# Patient Record
Sex: Female | Born: 1965 | Race: White | Hispanic: No | Marital: Married | Smoking: Current every day smoker
Health system: Southern US, Community
[De-identification: ages and names within clinical notes are randomized; demographics above are authoritative.]

## PROBLEM LIST (undated history)

## (undated) DIAGNOSIS — Z87442 Personal history of urinary calculi: Secondary | ICD-10-CM

## (undated) DIAGNOSIS — F419 Anxiety disorder, unspecified: Secondary | ICD-10-CM

## (undated) DIAGNOSIS — T4145XA Adverse effect of unspecified anesthetic, initial encounter: Secondary | ICD-10-CM

## (undated) DIAGNOSIS — K219 Gastro-esophageal reflux disease without esophagitis: Secondary | ICD-10-CM

## (undated) DIAGNOSIS — R7301 Impaired fasting glucose: Secondary | ICD-10-CM

## (undated) DIAGNOSIS — I10 Essential (primary) hypertension: Secondary | ICD-10-CM

## (undated) DIAGNOSIS — F32A Depression, unspecified: Secondary | ICD-10-CM

## (undated) DIAGNOSIS — R519 Headache, unspecified: Secondary | ICD-10-CM

## (undated) DIAGNOSIS — N200 Calculus of kidney: Secondary | ICD-10-CM

## (undated) DIAGNOSIS — L02213 Cutaneous abscess of chest wall: Secondary | ICD-10-CM

## (undated) DIAGNOSIS — Z720 Tobacco use: Secondary | ICD-10-CM

## (undated) DIAGNOSIS — E785 Hyperlipidemia, unspecified: Secondary | ICD-10-CM

## (undated) DIAGNOSIS — T8859XA Other complications of anesthesia, initial encounter: Secondary | ICD-10-CM

## (undated) DIAGNOSIS — R002 Palpitations: Secondary | ICD-10-CM

## (undated) DIAGNOSIS — E669 Obesity, unspecified: Secondary | ICD-10-CM

## (undated) DIAGNOSIS — F329 Major depressive disorder, single episode, unspecified: Secondary | ICD-10-CM

## (undated) HISTORY — DX: Cutaneous abscess of chest wall: L02.213

## (undated) HISTORY — DX: Depression, unspecified: F32.A

## (undated) HISTORY — PX: OTHER SURGICAL HISTORY: SHX169

## (undated) HISTORY — PX: TUBAL LIGATION: SHX77

## (undated) HISTORY — PX: BREAST BIOPSY: SHX20

## (undated) HISTORY — PX: BREAST LUMPECTOMY: SHX2

## (undated) HISTORY — DX: Tobacco use: Z72.0

## (undated) HISTORY — DX: Impaired fasting glucose: R73.01

## (undated) HISTORY — DX: Hyperlipidemia, unspecified: E78.5

## (undated) HISTORY — DX: Gastro-esophageal reflux disease without esophagitis: K21.9

## (undated) HISTORY — DX: Obesity, unspecified: E66.9

## (undated) HISTORY — DX: Anxiety disorder, unspecified: F41.9

## (undated) HISTORY — DX: Calculus of kidney: N20.0

## (undated) HISTORY — DX: Essential (primary) hypertension: I10

## (undated) HISTORY — DX: Major depressive disorder, single episode, unspecified: F32.9

## (undated) HISTORY — DX: Palpitations: R00.2

## (undated) HISTORY — PX: OVARIAN CYST REMOVAL: SHX89

---
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
## 1998-10-30 HISTORY — PX: OTHER SURGICAL HISTORY: SHX169

## 2004-12-16 ENCOUNTER — Emergency Department: Payer: Self-pay | Admitting: Unknown Physician Specialty

## 2004-12-18 ENCOUNTER — Emergency Department: Payer: Self-pay | Admitting: Emergency Medicine

## 2004-12-18 ENCOUNTER — Other Ambulatory Visit: Payer: Self-pay

## 2007-09-26 ENCOUNTER — Ambulatory Visit: Payer: Self-pay | Admitting: Emergency Medicine

## 2008-03-24 ENCOUNTER — Ambulatory Visit: Payer: Self-pay | Admitting: Internal Medicine

## 2009-03-31 ENCOUNTER — Ambulatory Visit: Payer: Self-pay

## 2009-04-09 ENCOUNTER — Ambulatory Visit: Payer: Self-pay | Admitting: Obstetrics and Gynecology

## 2009-04-19 ENCOUNTER — Ambulatory Visit: Payer: Self-pay | Admitting: Obstetrics and Gynecology

## 2010-03-27 ENCOUNTER — Emergency Department: Payer: Self-pay | Admitting: Emergency Medicine

## 2010-09-13 ENCOUNTER — Ambulatory Visit: Payer: Self-pay

## 2010-11-18 ENCOUNTER — Ambulatory Visit: Payer: Self-pay | Admitting: Surgery

## 2010-11-29 ENCOUNTER — Ambulatory Visit: Payer: Self-pay | Admitting: Surgery

## 2011-05-27 ENCOUNTER — Emergency Department: Payer: Self-pay | Admitting: *Deleted

## 2011-12-06 ENCOUNTER — Ambulatory Visit: Payer: Self-pay

## 2012-08-15 ENCOUNTER — Emergency Department: Payer: Self-pay | Admitting: Emergency Medicine

## 2012-08-15 LAB — TROPONIN I: Troponin-I: <0.02 ng/mL

## 2012-08-15 LAB — COMPREHENSIVE METABOLIC PANEL
Alkaline Phosphatase: 79 U/L (ref 50–136)
Anion Gap: 8 (ref 7–16)
BUN: 7 mg/dL (ref 7–18)
Bilirubin,Total: 0.4 mg/dL (ref 0.2–1.0)
Calcium, Total: 8.4 mg/dL — ABNORMAL LOW (ref 8.5–10.1)
Chloride: 105 mmol/L (ref 98–107)
Co2: 28 mmol/L (ref 21–32)
EGFR (Non-African Amer.): >60
Osmolality: 280 (ref 275–301)
Potassium: 4 mmol/L (ref 3.5–5.1)
SGOT(AST): 21 U/L (ref 15–37)
Total Protein: 6.7 g/dL (ref 6.4–8.2)

## 2012-08-15 LAB — CBC
HCT: 37.3 % (ref 35.0–47.0)
HGB: 12.8 g/dL (ref 12.0–16.0)
MCH: 29.2 pg (ref 26.0–34.0)
MCV: 85 fL (ref 80–100)
RBC: 4.37 10*6/uL (ref 3.80–5.20)

## 2012-08-15 LAB — CK TOTAL AND CKMB (NOT AT ARMC): CK, Total: 84 U/L (ref 21–215)

## 2012-11-22 ENCOUNTER — Emergency Department: Payer: Self-pay | Admitting: Emergency Medicine

## 2012-12-10 ENCOUNTER — Ambulatory Visit: Payer: Self-pay

## 2012-12-10 LAB — HM MAMMOGRAPHY

## 2012-12-10 LAB — HM PAP SMEAR

## 2014-03-31 ENCOUNTER — Emergency Department: Payer: Self-pay | Admitting: Emergency Medicine

## 2015-04-19 ENCOUNTER — Telehealth: Payer: Self-pay | Admitting: Family Medicine

## 2015-04-19 NOTE — Telephone Encounter (Signed)
E-Fax came through for refill:  Rx: Vitamin D 50000IU Cap Rx in basket

## 2015-04-19 NOTE — Telephone Encounter (Signed)
Patient notified, left detailed message

## 2015-04-19 NOTE — Telephone Encounter (Signed)
See practice part note from 03/03/15 please Should be taking OTC vitamin D 5,000 iu daily until about early to mid July, then 5,000 iu twice a week Thank you, Dr. Sherie Don

## 2015-08-03 ENCOUNTER — Telehealth: Payer: Self-pay | Admitting: Family Medicine

## 2015-08-03 NOTE — Telephone Encounter (Signed)
Routing to provider  

## 2015-08-03 NOTE — Telephone Encounter (Signed)
Last BP was up a bit; ask her to please stop by for BP check on lab schedule Also, ask her psychiatrist to call me about this prescription; we'd appreciate if the psychiatrist would write this and I'd like to talk to him or her if psychiatrist won't write it and wants me to keep writing it

## 2015-08-03 NOTE — Telephone Encounter (Signed)
Pt called stated she needs a refill on Cymbalta a fax needs to be sent to the [facility]. Pt stated all information needed is in her file. Thanks.

## 2015-08-04 NOTE — Telephone Encounter (Signed)
Thank you for the note; I still need the psychiatrist to call me and she needs to be under the care of a psychiatrist for the dose she is asking me to prescribe; please ask her to contact her psychiatrist and sign whatever releases are needed so I can speak to her psychiatrist before I write the prescription and I'll need to see a copy of the psychiatrist's Rx for this dose

## 2015-08-04 NOTE — Telephone Encounter (Signed)
Patient scheduled for BP check. She states that it's been a while since she went to [facility]. They will write the rx for her but they will not do the patient assistance rx programs. They assistance program is on the only way she can afford it.

## 2015-08-06 NOTE — Telephone Encounter (Signed)
Patient will call [facility] and have them call us and give permission for [place] to speak with them.

## 2015-08-09 MED ORDER — DULOXETINE HCL 60 MG PO CPEP: 60.0000 mg | ORAL_CAPSULE | Freq: Every day | ORAL | Status: DC

## 2015-08-09 NOTE — Telephone Encounter (Signed)
Patient called back and said she spoke to the staff at [facility] and they said you could call and speak with her doctor. Their number is [phone] She also needs her Cymbalta refilled thru the [place] program ASAP, she is out.

## 2015-08-09 NOTE — Telephone Encounter (Signed)
I called [facility] and they said patient needs to be seen They do not have a release on file, her therapist is out I talked to the patient personally She needs to come in; coming in tomorrow Taking cymbalta 60 mg daily, not taking BID and she quit the SSRI on her own She is not seeing the therapist and says she doesn't need to go back

## 2015-08-10 ENCOUNTER — Telehealth: Payer: Self-pay | Admitting: Family Medicine

## 2015-08-10 NOTE — Telephone Encounter (Signed)
Please move patient's "nurse" visit to a doctor visit with me in the next week; she is supposed to come in today for a BP check, but I found out yesterday that she is not seeing a psychiatrist so I need to see her for BP and mood for an official visit

## 2015-08-21 ENCOUNTER — Other Ambulatory Visit: Payer: Self-pay | Admitting: Family Medicine

## 2015-08-21 NOTE — Telephone Encounter (Signed)
She did not keep her last appt I will send one more refill, but I really need to see her Please let [facility] know that I'd like to see patient for an appointment here in the office for:  Mood, blood pressure Please schedule a visit with me  in the next: 30 days Fasting?  Yes (if morning appt, just skip breakfast --- if afternoon appt, okay to have breakfast, just skip lunch) Thank you, Dr. Sherie DonLada

## 2015-08-23 NOTE — Telephone Encounter (Signed)
08/30/15 appointment

## 2015-08-25 DIAGNOSIS — Z720 Tobacco use: Secondary | ICD-10-CM | POA: Insufficient documentation

## 2015-08-25 DIAGNOSIS — I10 Essential (primary) hypertension: Secondary | ICD-10-CM | POA: Insufficient documentation

## 2015-08-25 DIAGNOSIS — F32A Depression, unspecified: Secondary | ICD-10-CM | POA: Insufficient documentation

## 2015-08-25 DIAGNOSIS — R7301 Impaired fasting glucose: Secondary | ICD-10-CM | POA: Insufficient documentation

## 2015-08-25 DIAGNOSIS — E669 Obesity, unspecified: Secondary | ICD-10-CM | POA: Insufficient documentation

## 2015-08-25 DIAGNOSIS — K219 Gastro-esophageal reflux disease without esophagitis: Secondary | ICD-10-CM | POA: Insufficient documentation

## 2015-08-25 DIAGNOSIS — F419 Anxiety disorder, unspecified: Secondary | ICD-10-CM | POA: Insufficient documentation

## 2015-08-25 DIAGNOSIS — R002 Palpitations: Secondary | ICD-10-CM | POA: Insufficient documentation

## 2015-08-25 DIAGNOSIS — F329 Major depressive disorder, single episode, unspecified: Secondary | ICD-10-CM | POA: Insufficient documentation

## 2015-08-30 ENCOUNTER — Encounter: Payer: Self-pay | Admitting: Family Medicine

## 2015-08-30 ENCOUNTER — Ambulatory Visit (INDEPENDENT_AMBULATORY_CARE_PROVIDER_SITE_OTHER): Payer: Self-pay | Admitting: Family Medicine

## 2015-08-30 VITALS — BP 123/85 | HR 74 | Temp 97.6°F | Ht 64.5 in | Wt 228.0 lb

## 2015-08-30 DIAGNOSIS — E559 Vitamin D deficiency, unspecified: Secondary | ICD-10-CM

## 2015-08-30 DIAGNOSIS — E669 Obesity, unspecified: Secondary | ICD-10-CM

## 2015-08-30 DIAGNOSIS — F32A Depression, unspecified: Secondary | ICD-10-CM

## 2015-08-30 DIAGNOSIS — Z23 Encounter for immunization: Secondary | ICD-10-CM

## 2015-08-30 DIAGNOSIS — Z1239 Encounter for other screening for malignant neoplasm of breast: Secondary | ICD-10-CM

## 2015-08-30 DIAGNOSIS — F419 Anxiety disorder, unspecified: Secondary | ICD-10-CM

## 2015-08-30 DIAGNOSIS — F329 Major depressive disorder, single episode, unspecified: Secondary | ICD-10-CM

## 2015-08-30 DIAGNOSIS — Z720 Tobacco use: Secondary | ICD-10-CM

## 2015-08-30 DIAGNOSIS — R7301 Impaired fasting glucose: Secondary | ICD-10-CM

## 2015-08-30 DIAGNOSIS — Z5181 Encounter for therapeutic drug level monitoring: Secondary | ICD-10-CM

## 2015-08-30 DIAGNOSIS — I10 Essential (primary) hypertension: Secondary | ICD-10-CM

## 2015-08-30 MED ORDER — DULOXETINE HCL 60 MG PO CPEP: 60.0000 mg | ORAL_CAPSULE | Freq: Every day | ORAL | Status: DC

## 2015-08-30 NOTE — Assessment & Plan Note (Signed)
Order for mammogram entered; phone number to scheduling provided; patient will call and schedule her own study

## 2015-08-30 NOTE — Assessment & Plan Note (Signed)
Well-controlled currently; continue meds

## 2015-08-30 NOTE — Assessment & Plan Note (Signed)
Normal today; work on modest weight loss

## 2015-08-30 NOTE — Telephone Encounter (Signed)
Patient was seen today.

## 2015-08-30 NOTE — Assessment & Plan Note (Signed)
Check glucose and A1C today; patient was surprised to hear this; we reviewed her last 4 glucose readings, and 3 of the last 4 were completely normal

## 2015-08-30 NOTE — Assessment & Plan Note (Signed)
Patient not quite ready to quit; I am here to help if/when needed; see AVS

## 2015-08-30 NOTE — Assessment & Plan Note (Signed)
Flu vaccine discussed; recommended; given today

## 2015-08-30 NOTE — Progress Notes (Signed)
BP 123/85 mmHg  Pulse 74  Temp(Src) 97.6 F (36.4 C)  Ht 5' 4.5" (1.638 m)  Wt 228 lb (103.42 kg)  BMI 38.55 kg/m2  SpO2 96%  LMP 08/08/2015 (Approximate)   Subjective:    Patient ID: [facility], female    DOB: [birth year], 49 y.o.   MRN: [id number]  HPI: [name] is a 49 y.o. female  Chief Complaint  Patient presents with  . Anxiety  . Depression   No medical excitement Currently cymbalta 60 mg daily and that dose has been stable for awhile Still using clonazepam just once in a while; might go weeks in between; she does not know what triggers the shakiness and the need for a benzo; she gets tensed and panic; medicine helps her without causing her to feel goofy or overmedicated; knows to not drink any alcohol; cautions given  GAD 7 : Generalized Anxiety Score 08/30/2015  Nervous, Anxious, on Edge 1  Control/stop worrying 0  Worry too much - different things 0  Trouble relaxing 0  Restless 0  Easily annoyed or irritable 1  Afraid - awful might happen 0  Total GAD 7 Score 2  Anxiety Difficulty Somewhat difficult   Depression screen PHQ 2/9 08/30/2015  Decreased Interest 0  Down, Depressed, Hopeless 0  PHQ - 2 Score 0   She did get a flu shot this year; she is scared she'll get sick; she gets her mammograms at [facility]  She has lost six pounds since her last visit; BMI 38+; she is still smoking; she says she'll have to quit on her own but is not ready right now  History of vitamin D deficiency; taking 1000 iu every other day which helps reduce body aches  We reviewed her problem list and she was not aware that she had "prediabetes"; it was entered into her chart Feb 2014 by another provider here; she does not recall anyone ever talking to her about this; her mother has diabetes  Relevant past medical, surgical, family and social history reviewed and updated as indicated. Interim medical history since our last visit reviewed. Fam hx HTN, DM  Allergies and  medications reviewed and updated.  Review of Systems Per HPI unless specifically indicated above     Objective:    BP 123/85 mmHg  Pulse 74  Temp(Src) 97.6 F (36.4 C)  Ht 5' 4.5" (1.638 m)  Wt 228 lb (103.42 kg)  BMI 38.55 kg/m2  SpO2 96%  LMP 08/08/2015 (Approximate)  Wt Readings from Last 3 Encounters:  08/30/15 228 lb (103.42 kg)  03/12/15 234 lb (106.142 kg)    Physical Exam  Constitutional: She appears well-developed and well-nourished. No distress.  HENT:  Head: Normocephalic and atraumatic.  Eyes: EOM are normal. No scleral icterus.  Neck: No thyromegaly present.  Cardiovascular: Normal rate, regular rhythm and normal heart sounds.   No murmur heard. Pulmonary/Chest: Effort normal and breath sounds normal. No respiratory distress. She has no wheezes.  Abdominal: Soft. Bowel sounds are normal. She exhibits no distension.  Musculoskeletal: Normal range of motion. She exhibits no edema.  Neurological: She is alert. She exhibits normal muscle tone.  Skin: Skin is warm and dry. She is not diaphoretic. No pallor.  Psychiatric: She has a normal mood and affect. Her speech is normal and behavior is normal. Judgment and thought content normal. Her mood appears not anxious. Her affect is not labile. She does not exhibit a depressed mood.  Good eye contact with examiner  Assessment & Plan:   Problem List Items Addressed This Visit      Cardiovascular and Mediastinum   Hypertension - Primary    Normal today; work on modest weight loss        Endocrine   IFG (impaired fasting glucose)    Check glucose and A1C today; patient was surprised to hear this; we reviewed her last 4 glucose readings, and 3 of the last 4 were completely normal      Relevant Orders   Hgb A1c w/o eAG   Lipid Panel w/o Chol/HDL Ratio     Other   Depression    Well-controlled currently; continue meds      Relevant Medications   DULoxetine (CYMBALTA) 60 MG capsule   Anxiety     Continue SNRI; okay to refill benzo when due; cautions about unintentional overdose reviewed, never mix with pain pills, sleeping pills, alcohol      Relevant Medications   DULoxetine (CYMBALTA) 60 MG capsule   Tobacco use    Patient not quite ready to quit; I am here to help if/when needed; see AVS      Obesity    Praise given for losing 6 pounds, keep it up      Breast cancer screening    Order for mammogram entered; phone number to scheduling provided; patient will call and schedule her own study      Vitamin D deficiency   Relevant Orders   Vit D  25 hydroxy (rtn osteoporosis monitoring)   Medication monitoring encounter    Check labs today      Relevant Orders   Comprehensive metabolic panel   Needs flu shot    Flu vaccine discussed; recommended; given today       Other Visit Diagnoses    Encounter for immunization            Follow up plan: Return 1-2 months, for complete physical, 6 months for mood.  Orders Placed This Encounter  Procedures  . Flu Vaccine QUAD 36+ mos IM  . Hgb A1c w/o eAG  . Comprehensive metabolic panel  . Lipid Panel w/o Chol/HDL Ratio  . Vit D  25 hydroxy (rtn osteoporosis monitoring)   An after-visit summary was printed and given to the patient at check-out.  Please see the patient instructions which may contain other information and recommendations beyond what is mentioned above in the assessment and plan.

## 2015-08-30 NOTE — Patient Instructions (Addendum)
Please do call to schedule your mammogram; the number to schedule one at either [facility] or [facility] is [phone]  You can call 1-800-QUIT-NOW or [phone] for free smoking cessation classes  You received the flu shot today; it should protect you against the flu virus over the coming months; it will take about two weeks for antibodies to develop; do try to stay away from hospitals, nursing homes, and daycares during peak flu season; taking vitamin C daily during flu season may help you avoid getting sick  Check out the information at familydoctor.org entitled "What It Takes to Lose Weight" Try to lose between 1-2 pounds per week by taking in fewer calories and burning off more calories You can succeed by limiting portions, limiting foods dense in calories and fat, becoming more active, and drinking 8 glasses of water a day (64 ounces) Don't skip meals, especially breakfast, as skipping meals may alter your metabolism Do not use over-the-counter weight loss pills or gimmicks that claim rapid weight loss A healthy BMI (or body mass index) is between 18.5 and 24.9 You can calculate your ideal BMI at the NIH website [employer]  Return for physical in the next month or two  Smoking Cessation, Tips for Success If you are ready to quit smoking, congratulations! You have chosen to help yourself be healthier. Cigarettes bring nicotine, tar, carbon monoxide, and other irritants into your body. Your lungs, heart, and blood vessels will be able to work better without these poisons. There are many different ways to quit smoking. Nicotine gum, nicotine patches, a nicotine inhaler, or nicotine nasal spray can help with physical craving. Hypnosis, support groups, and medicines help break the habit of smoking. WHAT THINGS CAN I DO TO MAKE QUITTING EASIER?  Here are some tips to help you quit for good:  Pick a date  when you will quit smoking completely. Tell all of your friends and family about your plan to quit on that date.  Do not try to slowly cut down on the number of cigarettes you are smoking. Pick a quit date and quit smoking completely starting on that day.  Throw away all cigarettes.   Clean and remove all ashtrays from your home, work, and car.  On a card, write down your reasons for quitting. Carry the card with you and read it when you get the urge to smoke.  Cleanse your body of nicotine. Drink enough water and fluids to keep your urine clear or pale yellow. Do this after quitting to flush the nicotine from your body.  Learn to predict your moods. Do not let a bad situation be your excuse to have a cigarette. Some situations in your life might tempt you into wanting a cigarette.  Never have "just one" cigarette. It leads to wanting another and another. Remind yourself of your decision to quit.  Change habits associated with smoking. If you smoked while driving or when feeling stressed, try other activities to replace smoking. Stand up when drinking your coffee. Brush your teeth after eating. Sit in a different chair when you read the paper. Avoid alcohol while trying to quit, and try to drink fewer caffeinated beverages. Alcohol and caffeine may urge you to smoke.  Avoid foods and drinks that can trigger a desire to smoke, such as sugary or spicy foods and alcohol.  Ask people who smoke not to smoke around you.  Have something planned to do right after eating or having a cup of coffee.  For example, plan to take a walk or exercise.  Try a relaxation exercise to calm you down and decrease your stress. Remember, you may be tense and nervous for the first 2 weeks after you quit, but this will pass.  Find new activities to keep your hands busy. Play with a pen, coin, or rubber band. Doodle or draw things on paper.  Brush your teeth right after eating. This will help cut down on the craving  for the taste of tobacco after meals. You can also try mouthwash.   Use oral substitutes in place of cigarettes. Try using lemon drops, carrots, cinnamon sticks, or chewing gum. Keep them handy so they are available when you have the urge to smoke.  When you have the urge to smoke, try deep breathing.  Designate your home as a nonsmoking area.  If you are a heavy smoker, ask your health care provider about a prescription for nicotine chewing gum. It can ease your withdrawal from nicotine.  Reward yourself. Set aside the cigarette money you save and buy yourself something nice.  Look for support from others. Join a support group or smoking cessation program. Ask someone at home or at work to help you with your plan to quit smoking.  Always ask yourself, "Do I need this cigarette or is this just a reflex?" Tell yourself, "Today, I choose not to smoke," or "I do not want to smoke." You are reminding yourself of your decision to quit.  Do not replace cigarette smoking with electronic cigarettes (commonly called e-cigarettes). The safety of e-cigarettes is unknown, and some may contain harmful chemicals.  If you relapse, do not give up! Plan ahead and think about what you will do the next time you get the urge to smoke. HOW WILL I FEEL WHEN I QUIT SMOKING? You may have symptoms of withdrawal because your body is used to nicotine (the addictive substance in cigarettes). You may crave cigarettes, be irritable, feel very hungry, cough often, get headaches, or have difficulty concentrating. The withdrawal symptoms are only temporary. They are strongest when you first quit but will go away within 10-14 days. When withdrawal symptoms occur, stay in control. Think about your reasons for quitting. Remind yourself that these are signs that your body is healing and getting used to being without cigarettes. Remember that withdrawal symptoms are easier to treat than the major diseases that smoking can cause.   Even after the withdrawal is over, expect periodic urges to smoke. However, these cravings are generally short lived and will go away whether you smoke or not. Do not smoke! WHAT RESOURCES ARE AVAILABLE TO HELP ME QUIT SMOKING? Your health care provider can direct you to community resources or hospitals for support, which may include:  Group support.  Education.  Hypnosis.  Therapy.   This information is not intended to replace advice given to you by your health care provider. Make sure you discuss any questions you have with your health care provider.   Document Released: 07/14/2004 Document Revised: 11/06/2014 Document Reviewed: 04/03/2013 Elsevier Interactive Patient Education [employer]2016 Elsevier Inc.

## 2015-08-30 NOTE — Assessment & Plan Note (Signed)
Praise given for losing 6 pounds, keep it up

## 2015-08-30 NOTE — Assessment & Plan Note (Addendum)
Continue SNRI; okay to refill benzo when due; cautions about unintentional overdose reviewed, never mix with pain pills, sleeping pills, alcohol

## 2015-08-30 NOTE — Assessment & Plan Note (Signed)
Check labs today.

## 2015-08-31 ENCOUNTER — Telehealth: Payer: Self-pay

## 2015-08-31 ENCOUNTER — Telehealth: Payer: Self-pay | Admitting: Family Medicine

## 2015-08-31 DIAGNOSIS — R7301 Impaired fasting glucose: Secondary | ICD-10-CM

## 2015-08-31 DIAGNOSIS — E669 Obesity, unspecified: Secondary | ICD-10-CM

## 2015-08-31 DIAGNOSIS — E559 Vitamin D deficiency, unspecified: Secondary | ICD-10-CM

## 2015-08-31 DIAGNOSIS — E781 Pure hyperglyceridemia: Secondary | ICD-10-CM | POA: Insufficient documentation

## 2015-08-31 LAB — LIPID PANEL W/O CHOL/HDL RATIO
Cholesterol, Total: 171 mg/dL (ref 100–199)
HDL: 22 mg/dL — ABNORMAL LOW (ref >39)
Triglycerides: 620 mg/dL (ref 0–149)

## 2015-08-31 LAB — COMPREHENSIVE METABOLIC PANEL
ALK PHOS: 68 IU/L (ref 39–117)
ALT: 13 IU/L (ref 0–32)
AST: 13 IU/L (ref 0–40)
Albumin/Globulin Ratio: 1.7 (ref 1.1–2.5)
Albumin: 4.1 g/dL (ref 3.5–5.5)
BUN/Creatinine Ratio: 14 (ref 9–23)
BUN: 10 mg/dL (ref 6–24)
Bilirubin Total: 0.3 mg/dL (ref 0.0–1.2)
CALCIUM: 9.2 mg/dL (ref 8.7–10.2)
CO2: 24 mmol/L (ref 18–29)
CREATININE: 0.72 mg/dL (ref 0.57–1.00)
Chloride: 95 mmol/L — ABNORMAL LOW (ref 97–106)
GFR calc Af Amer: 115 mL/min/{1.73_m2} (ref >59)
GFR calc non Af Amer: 99 mL/min/{1.73_m2} (ref >59)
GLUCOSE: 94 mg/dL (ref 65–99)
Globulin, Total: 2.4 g/dL (ref 1.5–4.5)
Potassium: 4.2 mmol/L (ref 3.5–5.2)
SODIUM: 137 mmol/L (ref 136–144)
Total Protein: 6.5 g/dL (ref 6.0–8.5)

## 2015-08-31 LAB — VITAMIN D 25 HYDROXY (VIT D DEFICIENCY, FRACTURES): VIT D 25 HYDROXY: 27.9 ng/mL — AB (ref 30.0–100.0)

## 2015-08-31 LAB — HGB A1C W/O EAG: HEMOGLOBIN A1C: 5.6 % (ref 4.8–5.6)

## 2015-08-31 NOTE — Assessment & Plan Note (Signed)
Refer to nutrition therapy for obesity, high TG

## 2015-08-31 NOTE — Telephone Encounter (Signed)
I faxed her Cymbalta rx to [facility] program.

## 2015-08-31 NOTE — Assessment & Plan Note (Signed)
Start taking vit D 1000 iu daily

## 2015-08-31 NOTE — Assessment & Plan Note (Signed)
Explained high TG; start fish oil or krill oil TWO pills BID; recheck lipids in 3 months; work on weight loss, healthy eating, more activity

## 2015-08-31 NOTE — Assessment & Plan Note (Signed)
A1C back in normal range, excellent control

## 2015-08-31 NOTE — Telephone Encounter (Signed)
i talked with patient about lab results; TG very high; start fish oil or krill oil; recheck lipids in 3 months; refer to nutritionist for weight loss and diet coaching for high TG

## 2015-09-14 ENCOUNTER — Other Ambulatory Visit: Payer: Self-pay

## 2015-09-14 ENCOUNTER — Other Ambulatory Visit: Payer: Self-pay | Admitting: Family Medicine

## 2015-09-14 MED ORDER — HYDROCHLOROTHIAZIDE 25 MG PO TABS: 25.0000 mg | ORAL_TABLET | Freq: Every day | ORAL | Status: DC

## 2015-09-14 NOTE — Telephone Encounter (Signed)
Patient was last seen 08/30/15 and pharmacy is [facility].

## 2015-09-30 ENCOUNTER — Encounter: Payer: Self-pay | Admitting: Family Medicine

## 2015-10-01 ENCOUNTER — Encounter: Payer: Self-pay | Admitting: Family Medicine

## 2015-10-01 NOTE — Telephone Encounter (Signed)
Please check on this and take care of it; I prescribed a year's worth at the end of October

## 2015-10-01 NOTE — Telephone Encounter (Signed)
I sent mychart message back to patient stating that the form was sent to them on 08/31/15 and to call them to check on the status. I would refax it, but I can not see the original in epic.

## 2015-10-03 ENCOUNTER — Other Ambulatory Visit: Payer: Self-pay | Admitting: Family Medicine

## 2015-11-25 ENCOUNTER — Other Ambulatory Visit: Payer: Self-pay | Admitting: Family Medicine

## 2015-11-26 NOTE — Telephone Encounter (Signed)
Oct 2016 creatinine, -lytes reviewed; Rx approved

## 2016-01-01 ENCOUNTER — Other Ambulatory Visit: Payer: Self-pay | Admitting: Family Medicine

## 2016-01-03 ENCOUNTER — Other Ambulatory Visit: Payer: Self-pay

## 2016-01-03 MED ORDER — METOPROLOL TARTRATE 25 MG PO TABS: 12.5000 mg | ORAL_TABLET | Freq: Two times a day (BID) | ORAL | Status: DC

## 2016-01-03 NOTE — Telephone Encounter (Signed)
BP and pulse from Oct 2016 reviewed; Rx approved

## 2016-01-03 NOTE — Telephone Encounter (Signed)
Routing to provider  

## 2016-01-26 ENCOUNTER — Other Ambulatory Visit: Payer: Self-pay | Admitting: Family Medicine

## 2016-01-26 NOTE — Telephone Encounter (Signed)
Pt has an appt 02/28/16; Rx approved

## 2016-02-23 ENCOUNTER — Other Ambulatory Visit: Payer: Self-pay

## 2016-02-23 NOTE — Telephone Encounter (Signed)
I just approved a 3 month supply of this medicine on March 6th; please resolve with pharmacy; thank you

## 2016-02-23 NOTE — Telephone Encounter (Signed)
Routing to provider. She is following you to [facility]. 

## 2016-02-28 ENCOUNTER — Ambulatory Visit: Payer: Self-pay | Admitting: Family Medicine

## 2016-04-13 ENCOUNTER — Other Ambulatory Visit: Payer: Self-pay | Admitting: Unknown Physician Specialty

## 2016-04-14 NOTE — Telephone Encounter (Signed)
Is this a pt of ours?

## 2016-04-14 NOTE — Telephone Encounter (Signed)
Pt stated she is staying with [facility] and was scheduled with [facility] for 05/04/16. Thanks.

## 2016-04-14 NOTE — Telephone Encounter (Signed)
It looks like she is Dr. Marlise EvesLada's pt, she was last seen by Dr. Sherie DonLada 07/2015 but has cancelled a few appointments since. Thanks.

## 2016-04-14 NOTE — Telephone Encounter (Signed)
I see that.  Can we ask the pt if she is staying with Dr Sherie DonLada or should we make an appointment for there here?

## 2016-05-04 ENCOUNTER — Ambulatory Visit: Payer: Self-pay | Admitting: Family Medicine

## 2016-05-11 ENCOUNTER — Other Ambulatory Visit: Payer: Self-pay | Admitting: Unknown Physician Specialty

## 2016-05-12 ENCOUNTER — Ambulatory Visit: Payer: Self-pay | Admitting: Family Medicine

## 2016-05-12 NOTE — Telephone Encounter (Signed)
Patient is staying here, she is scheduled to see you on June 01, 2016.

## 2016-05-12 NOTE — Telephone Encounter (Signed)
Your patient.  Thanks 

## 2016-05-12 NOTE — Telephone Encounter (Signed)
Can you find out if she's staying here or going with Dr. Sherie DonLada- she's had appointments scheduled and cancelled at both. Thanks!

## 2016-05-17 ENCOUNTER — Other Ambulatory Visit: Payer: Self-pay | Admitting: Family Medicine

## 2016-05-17 NOTE — Telephone Encounter (Signed)
[facility]

## 2016-06-01 ENCOUNTER — Ambulatory Visit: Payer: Self-pay | Admitting: Urology

## 2016-06-01 ENCOUNTER — Ambulatory Visit: Payer: Self-pay | Admitting: Family Medicine

## 2016-06-01 VITALS — BP 134/84 | HR 90 | Wt 230.0 lb

## 2016-06-01 DIAGNOSIS — Z1239 Encounter for other screening for malignant neoplasm of breast: Secondary | ICD-10-CM

## 2016-06-01 DIAGNOSIS — I10 Essential (primary) hypertension: Secondary | ICD-10-CM

## 2016-06-01 MED ORDER — VARENICLINE TARTRATE 0.5 MG PO TABS: 0.5000 mg | ORAL_TABLET | Freq: Two times a day (BID) | ORAL | 0 refills | Status: DC

## 2016-06-01 MED ORDER — LANSOPRAZOLE 15 MG PO CPDR: 15.0000 mg | DELAYED_RELEASE_CAPSULE | Freq: Every day | ORAL | 0 refills | Status: DC

## 2016-06-01 MED ORDER — HYDROCHLOROTHIAZIDE 25 MG PO TABS: 25.0000 mg | ORAL_TABLET | Freq: Every day | ORAL | 0 refills | Status: DC

## 2016-06-01 MED ORDER — METOPROLOL TARTRATE 25 MG PO TABS: 25.0000 mg | ORAL_TABLET | Freq: Every day | ORAL | 0 refills | Status: DC

## 2016-06-01 NOTE — Progress Notes (Signed)
  Patient: Monica Moody Female    DOB: August 28, 1966   50 y.o.   MRN: [id number] Visit Date: 06/01/2016  Today's Provider: [facility]   Chief Complaint  Patient presents with  . New Patient (Initial Visit)    Wants to get established as a patient so that she can begin taking medications on med list.  . Nicotine Dependence    willing to quit smoking, but needs to find medication that addresses withdrawal symptoms.    Subjective:    HPI Patient has not been without meds.  She has a two week supply of them. Would like to get off the Cymbalta.  Would like to quit smoking.      Allergies  Allergen Reactions  . Bupropion Other (See Comments)    Hands peel  . Celexa [Citalopram Hydrobromide] Other (See Comments)    Affects heart  . Penicillins Swelling   Previous Medications   CHOLECALCIFEROL (VITAMIN D) 1000 UNITS TABLET    Take 1,000 Units by mouth daily. Every other day.   CLONAZEPAM (KLONOPIN) 1 MG TABLET    Take 1 mg by mouth 2 (two) times daily as needed for anxiety.   DULOXETINE (CYMBALTA) 60 MG CAPSULE    TAKE 1 CAPSULE BY MOUTH ONCE DAILY.    Review of Systems  Social History  Substance Use Topics  . Smoking status: Current Every Day Smoker    Packs/day: 0.50    Years: 30.00    Types: Cigarettes  . Smokeless tobacco: Never Used  . Alcohol use No     Comment: occasionally   Objective:   BP 134/84 (BP Location: Left Arm)   Pulse 90   Wt 230 lb (104.3 kg)   LMP 05/25/2016 (Approximate)   BMI 38.87 kg/m   Physical Exam Constitutional: Well nourished. Alert and oriented, No acute distress. HEENT: [place] AT, moist mucus membranes. Trachea midline, no masses. Cardiovascular: No clubbing, cyanosis, or edema. Respiratory: Normal respiratory effort, no increased work of breathing. GI: Abdomen is soft, non tender, non distended, no abdominal masses. Liver and spleen not palpable.  No hernias appreciated.  Stool sample for occult testing is not indicated.   GU: No  CVA tenderness.  No bladder fullness or masses.   Skin: No rashes, bruises or suspicious lesions. Lymph: No cervical or inguinal adenopathy. Neurologic: Grossly intact, no focal deficits, moving all 4 extremities. Psychiatric: Normal mood and affect.      Assessment & Plan:     1. HTN  -good control  -meds refilled  2. Tobacco abuse  -failed patches  -script sent for Chantix  3. GERD  -lansoprazole refilled  4. Anxiety/Depression  -refer to mental health  5. Health maintenance  -needs dentist  -needs eye exam  -needs mammogram  -needs Pap smear    Check TSH, CMP, HbgA1c, CBC and lipids   [facility]   [facility]

## 2016-06-02 LAB — CBC WITH DIFFERENTIAL/PLATELET
BASOS: 0 %
Basophils Absolute: 0 10*3/uL (ref 0.0–0.2)
EOS (ABSOLUTE): 0.1 10*3/uL (ref 0.0–0.4)
EOS: 1 %
HEMATOCRIT: 41.6 % (ref 34.0–46.6)
Hemoglobin: 14 g/dL (ref 11.1–15.9)
Immature Grans (Abs): 0.1 10*3/uL (ref 0.0–0.1)
Immature Granulocytes: 1 %
LYMPHS ABS: 2.4 10*3/uL (ref 0.7–3.1)
Lymphs: 28 %
MCH: 28.9 pg (ref 26.6–33.0)
MCHC: 33.7 g/dL (ref 31.5–35.7)
MCV: 86 fL (ref 79–97)
MONOS ABS: 0.5 10*3/uL (ref 0.1–0.9)
Monocytes: 5 %
Neutrophils Absolute: 5.7 10*3/uL (ref 1.4–7.0)
Neutrophils: 65 %
Platelets: 258 10*3/uL (ref 150–379)
RBC: 4.84 x10E6/uL (ref 3.77–5.28)
RDW: 14.4 % (ref 12.3–15.4)
WBC: 8.8 10*3/uL (ref 3.4–10.8)

## 2016-06-02 LAB — COMPREHENSIVE METABOLIC PANEL
A/G RATIO: 1.5 (ref 1.2–2.2)
ALT: 15 IU/L (ref 0–32)
AST: 17 IU/L (ref 0–40)
Albumin: 4.2 g/dL (ref 3.5–5.5)
Alkaline Phosphatase: 66 IU/L (ref 39–117)
BUN/Creatinine Ratio: 21 (ref 9–23)
BUN: 14 mg/dL (ref 6–24)
Bilirubin Total: 0.3 mg/dL (ref 0.0–1.2)
CALCIUM: 9.6 mg/dL (ref 8.7–10.2)
CO2: 29 mmol/L (ref 18–29)
CREATININE: 0.67 mg/dL (ref 0.57–1.00)
Chloride: 93 mmol/L — ABNORMAL LOW (ref 96–106)
GFR, EST AFRICAN AMERICAN: 119 mL/min/{1.73_m2} (ref >59)
GFR, EST NON AFRICAN AMERICAN: 104 mL/min/{1.73_m2} (ref >59)
GLOBULIN, TOTAL: 2.8 g/dL (ref 1.5–4.5)
Glucose: 81 mg/dL (ref 65–99)
Potassium: 3.5 mmol/L (ref 3.5–5.2)
SODIUM: 138 mmol/L (ref 134–144)
TOTAL PROTEIN: 7 g/dL (ref 6.0–8.5)

## 2016-06-02 LAB — LIPID PANEL
Chol/HDL Ratio: 8.4 ratio units — ABNORMAL HIGH (ref 0.0–4.4)
Cholesterol, Total: 219 mg/dL — ABNORMAL HIGH (ref 100–199)
HDL: 26 mg/dL — AB (ref >39)
TRIGLYCERIDES: 955 mg/dL — AB (ref 0–149)

## 2016-06-02 LAB — TSH: TSH: 2.71 u[IU]/mL (ref 0.450–4.500)

## 2016-06-02 LAB — HEMOGLOBIN A1C
ESTIMATED AVERAGE GLUCOSE: 117 mg/dL
Hgb A1c MFr Bld: 5.7 % — ABNORMAL HIGH (ref 4.8–5.6)

## 2016-06-08 ENCOUNTER — Ambulatory Visit: Payer: Self-pay | Admitting: Licensed Clinical Social Worker

## 2016-06-12 ENCOUNTER — Ambulatory Visit: Payer: Self-pay

## 2016-06-21 ENCOUNTER — Ambulatory Visit: Payer: Self-pay | Admitting: Ophthalmology

## 2016-06-26 ENCOUNTER — Other Ambulatory Visit: Payer: Self-pay | Admitting: Family Medicine

## 2016-06-27 ENCOUNTER — Ambulatory Visit: Payer: Self-pay

## 2016-07-06 ENCOUNTER — Ambulatory Visit: Payer: Self-pay | Admitting: Family Medicine

## 2016-07-06 DIAGNOSIS — E782 Mixed hyperlipidemia: Secondary | ICD-10-CM

## 2016-07-06 DIAGNOSIS — E781 Pure hyperglyceridemia: Secondary | ICD-10-CM

## 2016-07-06 DIAGNOSIS — I10 Essential (primary) hypertension: Secondary | ICD-10-CM

## 2016-07-06 DIAGNOSIS — Z6841 Body Mass Index (BMI) 40.0 and over, adult: Secondary | ICD-10-CM | POA: Insufficient documentation

## 2016-07-06 MED ORDER — METOPROLOL TARTRATE 50 MG PO TABS: 50.0000 mg | ORAL_TABLET | Freq: Two times a day (BID) | ORAL | 4 refills | Status: DC

## 2016-07-06 NOTE — Assessment & Plan Note (Signed)
Discuss elevated triglycerides levels of been drawn nonfasting discussed risk of pancreatitis and management of triglycerides with diet nutrition exercise weight loss. Patient will start this care and recheck fasting lipid panel 1 month.

## 2016-07-06 NOTE — Assessment & Plan Note (Signed)
Discuss hypertension poor control will increase metoprolol from 25-50 mg and patient will take twice a day as indicated.

## 2016-07-06 NOTE — Assessment & Plan Note (Signed)
Discuss weight loss

## 2016-07-06 NOTE — Progress Notes (Signed)
BP (!) 128/94   Pulse 97   Temp 98.4 F (36.9 C)   Resp 16   Ht 5\' 3"  (1.6 m)   Wt 234 lb (106.1 kg)   SpO2 98%   BMI 41.45 kg/m    Subjective:    Patient ID: Monica Moody, female    DOB: 05/27/1966, 50 y.o.   MRN: [id number]  HPI: Monica Moody is a 50 y.o. female  Chief Complaint  Patient presents with  . Follow-up   Patient follow-up with elevated triglycerides has been doing well also elevated blood pressure is noted today. Patient has thought about losing weight but is been gaining.  Relevant past medical, surgical, family and social history reviewed and updated as indicated. Interim medical history since our last visit reviewed. Allergies and medications reviewed and updated.  Review of Systems  Respiratory: Negative.   Cardiovascular: Negative.     Per HPI unless specifically indicated above     Objective:    BP (!) 128/94   Pulse 97   Temp 98.4 F (36.9 C)   Resp 16   Ht 5\' 3"  (1.6 m)   Wt 234 lb (106.1 kg)   SpO2 98%   BMI 41.45 kg/m   Wt Readings from Last 3 Encounters:  07/06/16 234 lb (106.1 kg)  06/01/16 230 lb (104.3 kg)  08/30/15 228 lb (103.4 kg)    Physical Exam  Constitutional: She is oriented to person, place, and time. She appears well-developed and well-nourished. No distress.  HENT:  Head: Normocephalic and atraumatic.  Right Ear: Hearing normal.  Left Ear: Hearing normal.  Nose: Nose normal.  Eyes: Conjunctivae and lids are normal. Right eye exhibits no discharge. Left eye exhibits no discharge. No scleral icterus.  Cardiovascular: Normal rate, regular rhythm and normal heart sounds.   Pulmonary/Chest: Effort normal and breath sounds normal. No respiratory distress.  Musculoskeletal: Normal range of motion.  Neurological: She is alert and oriented to person, place, and time.  Skin: Skin is intact. No rash noted.  Psychiatric: She has a normal mood and affect. Her speech is normal and behavior is normal. Judgment and thought  content normal. Cognition and memory are normal.    Results for orders placed or performed in visit on 06/01/16  CBC with Differential/Platelet  Result Value Ref Range   WBC 8.8 3.4 - 10.8 x10E3/uL   RBC 4.84 3.77 - 5.28 x10E6/uL   Hemoglobin 14.0 11.1 - 15.9 g/dL   Hematocrit 40.9 81.1 - 46.6 %   MCV 86 79 - 97 fL   MCH 28.9 26.6 - 33.0 pg   MCHC 33.7 31.5 - 35.7 g/dL   RDW 91.4 78.2 - 95.6 %   Platelets 258 150 - 379 x10E3/uL   Neutrophils 65 %   Lymphs 28 %   Monocytes 5 %   Eos 1 %   Basos 0 %   Neutrophils Absolute 5.7 1.4 - 7.0 x10E3/uL   Lymphocytes Absolute 2.4 0.7 - 3.1 x10E3/uL   Monocytes Absolute 0.5 0.1 - 0.9 x10E3/uL   EOS (ABSOLUTE) 0.1 0.0 - 0.4 x10E3/uL   Basophils Absolute 0.0 0.0 - 0.2 x10E3/uL   Immature Granulocytes 1 %   Immature Grans (Abs) 0.1 0.0 - 0.1 x10E3/uL  Comprehensive metabolic panel  Result Value Ref Range   Glucose 81 65 - 99 mg/dL   BUN 14 6 - 24 mg/dL   Creatinine, Ser 2.13 0.57 - 1.00 mg/dL   GFR calc non Af Amer 104 >59  mL/min/1.73   GFR calc Af Amer 119 >59 mL/min/1.73   BUN/Creatinine Ratio 21 9 - 23   Sodium 138 134 - 144 mmol/L   Potassium 3.5 3.5 - 5.2 mmol/L   Chloride 93 (L) 96 - 106 mmol/L   CO2 29 18 - 29 mmol/L   Calcium 9.6 8.7 - 10.2 mg/dL   Total Protein 7.0 6.0 - 8.5 g/dL   Albumin 4.2 3.5 - 5.5 g/dL   Globulin, Total 2.8 1.5 - 4.5 g/dL   Albumin/Globulin Ratio 1.5 1.2 - 2.2   Bilirubin Total 0.3 0.0 - 1.2 mg/dL   Alkaline Phosphatase 66 39 - 117 IU/L   AST 17 0 - 40 IU/L   ALT 15 0 - 32 IU/L  Hemoglobin A1c  Result Value Ref Range   Hgb A1c MFr Bld 5.7 (H) 4.8 - 5.6 %   Est. average glucose Bld gHb Est-mCnc 117 mg/dL  TSH  Result Value Ref Range   TSH 2.710 0.450 - 4.500 uIU/mL  Lipid panel  Result Value Ref Range   Cholesterol, Total 219 (H) 100 - 199 mg/dL   Triglycerides 846955 (HH) 0 - 149 mg/dL   HDL 26 (L) >96>39 mg/dL   VLDL Cholesterol Cal Comment 5 - 40 mg/dL   LDL Calculated Comment 0 - 99 mg/dL    Chol/HDL Ratio 8.4 (H) 0.0 - 4.4 ratio units      Assessment & Plan:   Problem List Items Addressed This Visit      Cardiovascular and Mediastinum   Hypertension    Discuss hypertension poor control will increase metoprolol from 25-50 mg and patient will take twice a day as indicated.      Relevant Medications   metoprolol tartrate (LOPRESSOR) 50 MG tablet     Other   Hypertriglyceridemia    Discuss elevated triglycerides levels of been drawn nonfasting discussed risk of pancreatitis and management of triglycerides with diet nutrition exercise weight loss. Patient will start this care and recheck fasting lipid panel 1 month.      Relevant Medications   metoprolol tartrate (LOPRESSOR) 50 MG tablet   BMI 40.0-44.9, adult (HCC)    Discuss weight loss       Other Visit Diagnoses    Elevated triglycerides with high cholesterol       Relevant Medications   metoprolol tartrate (LOPRESSOR) 50 MG tablet       Follow up plan: Return in about 4 weeks (around 08/03/2016) for lipids for elevated triglycerides and recheck blood pressure management.

## 2016-08-01 ENCOUNTER — Other Ambulatory Visit: Payer: Self-pay

## 2016-08-01 DIAGNOSIS — E781 Pure hyperglyceridemia: Secondary | ICD-10-CM

## 2016-08-02 LAB — LIPID PANEL W/O CHOL/HDL RATIO
Cholesterol, Total: 202 mg/dL — ABNORMAL HIGH (ref 100–199)
HDL: 26 mg/dL — ABNORMAL LOW (ref >39)
Triglycerides: 672 mg/dL (ref 0–149)

## 2016-08-22 ENCOUNTER — Ambulatory Visit: Payer: Self-pay | Admitting: Adult Health Nurse Practitioner

## 2016-08-22 VITALS — BP 125/80 | HR 81 | Ht 63.0 in | Wt 234.0 lb

## 2016-08-22 DIAGNOSIS — I10 Essential (primary) hypertension: Secondary | ICD-10-CM

## 2016-08-22 DIAGNOSIS — E782 Mixed hyperlipidemia: Secondary | ICD-10-CM

## 2016-08-22 NOTE — Progress Notes (Signed)
  Patient: Monica Moody Female    DOB: 03/26/1966   50 y.o.   MRN: [id number] Visit Date: 08/22/2016  Today's Provider: [facility]   Chief Complaint  Patient presents with  . Hypertension   Subjective:    HPI   HLD:  Triglycerides 672 on last labs.  Never been on cholesterol medications.  + FH for hyperlipidemia.  Trying to monitor fat/cholesterol in diet.  2x weekly walking.    HTN:  Taking BP meds as directed.  Metoprolol increased at last visit.  Taking BP at home-reports readings are good.    Allergies  Allergen Reactions  . Bupropion Other (See Comments)    Hands peel  . Celexa [Citalopram Hydrobromide] Other (See Comments)    Affects heart  . Penicillins Swelling   Previous Medications   CHOLECALCIFEROL (VITAMIN D) 1000 UNITS TABLET    Take 1,000 Units by mouth daily. Every other day.   CLONAZEPAM (KLONOPIN) 1 MG TABLET    Take 1 mg by mouth 2 (two) times daily as needed for anxiety.   DULOXETINE (CYMBALTA) 60 MG CAPSULE    TAKE 1 CAPSULE BY MOUTH ONCE DAILY.   HYDROCHLOROTHIAZIDE (HYDRODIURIL) 25 MG TABLET    Take 1 tablet (25 mg total) by mouth daily.   LAMOTRIGINE (LAMICTAL) 25 MG TABLET    Take by mouth daily.   LANSOPRAZOLE (PREVACID) 15 MG CAPSULE    Take 1 capsule (15 mg total) by mouth daily at 12 noon.   METOPROLOL TARTRATE (LOPRESSOR) 50 MG TABLET    Take 1 tablet (50 mg total) by mouth 2 (two) times daily.   VARENICLINE (CHANTIX) 0.5 MG TABLET    Take 1 tablet (0.5 mg total) by mouth 2 (two) times daily. For the first three days, take one tablet    Review of Systems  All other systems reviewed and are negative.   Social History  Substance Use Topics  . Smoking status: Current Every Day Smoker    Packs/day: 0.50    Years: 30.00    Types: Cigarettes  . Smokeless tobacco: Never Used  . Alcohol use No     Comment: occasionally   Objective:   BP 125/80 (BP Location: Right Arm, Patient Position: Sitting, Cuff Size: Large)   Pulse 81    Ht 5\' 3"  (1.6 m)   Wt 234 lb (106.1 kg)   BMI 41.45 kg/m   Physical Exam  Constitutional: She is oriented to person, place, and time. She appears well-developed and well-nourished.  HENT:  Head: Normocephalic and atraumatic.  Eyes: Pupils are equal, round, and reactive to light.  Neck: Normal range of motion. Neck supple.  Cardiovascular: Normal rate, regular rhythm and normal heart sounds.   Pulmonary/Chest: Effort normal and breath sounds normal.  Neurological: She is alert and oriented to person, place, and time.  Psychiatric: She has a normal mood and affect.        Assessment & Plan:         HTN:  Controlled.  Goal BP <140/90.  Continue current medication regimen.  Encourage low salt diet and exercise.   HLD:   Not Controlled.  Start Atorvastatin 10mg  nightly. SE profile reviewed.  Encourage low cholesterol, low fat diet and exercise.     [facility]   [facility] [place]

## 2016-09-18 ENCOUNTER — Other Ambulatory Visit: Payer: Self-pay | Admitting: Urology

## 2016-09-18 DIAGNOSIS — I10 Essential (primary) hypertension: Secondary | ICD-10-CM

## 2016-10-10 ENCOUNTER — Ambulatory Visit: Payer: Self-pay | Admitting: Adult Health Nurse Practitioner

## 2016-10-10 DIAGNOSIS — M79641 Pain in right hand: Secondary | ICD-10-CM | POA: Insufficient documentation

## 2016-10-10 DIAGNOSIS — M545 Low back pain, unspecified: Secondary | ICD-10-CM | POA: Insufficient documentation

## 2016-10-10 DIAGNOSIS — G8929 Other chronic pain: Secondary | ICD-10-CM

## 2016-10-10 NOTE — Progress Notes (Signed)
Patient: Monica Moody Female    DOB: 12/11/1965   50 y.o.   MRN: [id number] Visit Date: 10/10/2016  Today's Provider: [facility]   Chief Complaint  Patient presents with  . Pain    Finger joints mainly pinky right hand  . Hypertension   Subjective:    HPI     Pt states she has "bone pain" in her hands. Mostly on the R but hurts occasionally in both hands.  And pain in her back.  Pt states that she has vitamin D deficiency and the pain was better when she received treatment.  Pt states that she is getting knots on her joints that are causing her fingers to bend down.  No numbness or tingling.  Pt states that the pain varies in duration- nothing causes the pain.  Pt states the pain "flares up" 4-5 times per month.  Pt states that her mother has RA- pt has not been checked for RA.  Tylenol/ibuprofen does not help her fingers.   Pt states that her back hurts when she stands or bends. Pt states that it hurts daily- pain varies form 2-9/10.  Pt states that she takes tylenol/ibuprofen with heat patches and feels relief.  Pt states that she has been having the back pain for a few years but notes that it is getting worse.    Allergies  Allergen Reactions  . Bupropion Other (See Comments)    Hands peel  . Celexa [Citalopram Hydrobromide] Other (See Comments)    Affects heart  . Penicillins Swelling   Previous Medications   CHOLECALCIFEROL (VITAMIN D) 1000 UNITS TABLET    Take 1,000 Units by mouth daily. Every other day.   CLONAZEPAM (KLONOPIN) 1 MG TABLET    Take 1 mg by mouth 2 (two) times daily as needed for anxiety.   DULOXETINE (CYMBALTA) 60 MG CAPSULE    TAKE 1 CAPSULE BY MOUTH ONCE DAILY.   HYDROCHLOROTHIAZIDE (HYDRODIURIL) 25 MG TABLET    Take 1 tablet by mouth daily.   LAMOTRIGINE (LAMICTAL) 25 MG TABLET    Take by mouth daily.   LANSOPRAZOLE (PREVACID) 15 MG CAPSULE    Take 1 capsule (15 mg total) by mouth daily at 12 noon.   METOPROLOL TARTRATE (LOPRESSOR)  50 MG TABLET    Take 1 tablet (50 mg total) by mouth 2 (two) times daily.   VARENICLINE (CHANTIX) 0.5 MG TABLET    Take 1 tablet (0.5 mg total) by mouth 2 (two) times daily. For the first three days, take one tablet    Review of Systems  All other systems reviewed and are negative.   Social History  Substance Use Topics  . Smoking status: Current Every Day Smoker    Packs/day: 0.50    Years: 30.00    Types: Cigarettes  . Smokeless tobacco: Never Used  . Alcohol use No     Comment: occasionally   Objective:   BP 132/85   Pulse 85   Wt 227 lb (103 kg)   BMI 40.21 kg/m   Physical Exam  Constitutional: She is oriented to person, place, and time. She appears well-developed and well-nourished.  HENT:  Head: Normocephalic and atraumatic.  Eyes: Pupils are equal, round, and reactive to light.  Neck: Normal range of motion. Neck supple.  Cardiovascular: Normal rate, regular rhythm and normal heart sounds.   Pulmonary/Chest: Effort normal and breath sounds normal.  Abdominal: Soft. Bowel sounds are normal.  Musculoskeletal:  R hand 4 and 5  digit with redness and warmth to the distal joint. Full ROM of all finger joint and wrist. Tenderness to palpation. Slightly swollen.  Back pain with extension and flexion. Tenderness to palpation of the L spine. No spasms felt.   Neurological: She is alert and oriented to person, place, and time.  Skin: Skin is warm and dry.  Psychiatric: She has a normal mood and affect.  Vitals reviewed.       Assessment & Plan:         LBP" Continue tylenol/ibuprofen in rotation.  Will schedule to see the chiropractor.  Continue heat/ice in rotation.   Hand Pain:  Rheumatoid factor, Sed Rate and Uric Acid.    [facility]    [facility] [place]

## 2016-10-12 ENCOUNTER — Other Ambulatory Visit: Payer: Self-pay

## 2016-10-18 ENCOUNTER — Ambulatory Visit: Payer: Self-pay | Admitting: Chiropractor

## 2016-10-18 ENCOUNTER — Encounter: Payer: Self-pay | Admitting: Chiropractor

## 2016-10-18 DIAGNOSIS — M9903 Segmental and somatic dysfunction of lumbar region: Secondary | ICD-10-CM

## 2016-10-18 DIAGNOSIS — R293 Abnormal posture: Secondary | ICD-10-CM

## 2016-10-18 DIAGNOSIS — M62830 Muscle spasm of back: Secondary | ICD-10-CM

## 2016-10-18 DIAGNOSIS — M9901 Segmental and somatic dysfunction of cervical region: Secondary | ICD-10-CM

## 2016-10-18 NOTE — Progress Notes (Signed)
S: Patient presents with central low back pain where the spine meets the sacrum. Patient states she has had the pain for the past few years but is getting significantly worse lately. Patient cannot identify how the pain began or why it is getting worse. Patient states it came on over time. Sitting, rest, and occasional heat makes the LBP improve. She has tried OTC pain meds but they do not improve the pain. Standing, flexion, and household chores that require her to bend over increase the pain. The pain is a general ache. Pain is pinpoint and does not radiate. Pain level today is an 8/10. Pain is constant. No loss of bowel or bladder control. No numbness or weakness.   O: Posture analysis resulted in a right head tilt with a right high shoulder. Patient's left hip was high. Anterior head carriage noted as well.  Csp AROM was WNL. Lumbar AROM was WNL with pain during extension and right lateral flexion.  Segmental dysfunction noted at C2 left, C5 right, T4 left, T9 left, L2 right, L5 right, and SI right Pi. Othro tests - max foraminal compression, Jacksons, distractions and Csp valsalva were negative. Straight leg raise, braggards, hibbs, and Lsp valsalva were also negative. Kemps test elicited left sided SI pain. Yeomans test elicited right sided SI pain. Neuro tests - DTR L4-S1 2, Motor L4-S1 5, Dermatones L4-S1 normal bilateral.  A: Subacute phase, 1x EOW until next re-exam.  P: SMT at segmental levels listed above. Patient received care without incident. ROF completed prior to Tx.

## 2016-10-18 NOTE — Patient Instructions (Signed)
Drink lots of water for the next 24 hours. If soreness develops, ice low back for 20 mins every 2 hours.

## 2016-10-25 ENCOUNTER — Other Ambulatory Visit: Payer: Self-pay | Admitting: Internal Medicine

## 2016-10-26 ENCOUNTER — Other Ambulatory Visit: Payer: Self-pay

## 2016-10-26 DIAGNOSIS — R002 Palpitations: Secondary | ICD-10-CM

## 2016-10-27 LAB — RHEUMATOID FACTOR

## 2016-10-27 LAB — SEDIMENTATION RATE: SED RATE: 8 mm/h (ref 0–32)

## 2016-11-01 ENCOUNTER — Telehealth: Payer: Self-pay

## 2016-11-01 ENCOUNTER — Ambulatory Visit: Payer: Self-pay | Admitting: Chiropractor

## 2016-11-01 NOTE — Telephone Encounter (Signed)
Chiropractor wanted to reschedule missed apt on 11/01/16 for 11/15/16. Left message at 11:06am and asked pt to return call.

## 2016-11-03 ENCOUNTER — Telehealth: Payer: Self-pay

## 2016-11-03 NOTE — Telephone Encounter (Signed)
Called pt to give results. Pt verbalized understanding.  

## 2016-11-03 NOTE — Telephone Encounter (Signed)
-----   Message from Harle BattiestShannon A McGowan, PA-C sent at 11/01/2016  9:50 PM EST ----- Please let the patient know that her tests for rheumatoid are negative.

## 2016-11-22 ENCOUNTER — Other Ambulatory Visit: Payer: Self-pay | Admitting: Urology

## 2016-12-26 ENCOUNTER — Ambulatory Visit: Payer: Self-pay

## 2017-01-01 ENCOUNTER — Telehealth: Payer: Self-pay | Admitting: Pharmacist

## 2017-01-01 NOTE — Telephone Encounter (Signed)
01/01/17 Called [employer] for refill on Chantix, to release March 23

## 2017-01-02 ENCOUNTER — Ambulatory Visit: Payer: Self-pay | Admitting: Urology

## 2017-01-02 VITALS — BP 123/80 | HR 80 | Temp 97.0°F | Ht 63.0 in | Wt 234.6 lb

## 2017-01-02 DIAGNOSIS — E781 Pure hyperglyceridemia: Secondary | ICD-10-CM

## 2017-01-02 MED ORDER — ATORVASTATIN CALCIUM 10 MG PO TABS: 10.0000 mg | ORAL_TABLET | Freq: Every day | ORAL | 0 refills | Status: DC

## 2017-01-02 NOTE — Progress Notes (Signed)
  Patient: Pollyann Savoymy F Heinkel Female    DOB: 08/28/1966   51 y.o.   MRN: [id number] Visit Date: 01/02/2017  Today's Provider: [facility]   Chief Complaint  Patient presents with  . Follow-up    Was called for a check up appointment   Subjective:    HPI Patient has not had her lipids checked since she has been on Atorvastatin 10 mg qhs since October 2017.          Allergies  Allergen Reactions  . Bupropion Other (See Comments)    Hands peel  . Celexa [Citalopram Hydrobromide] Other (See Comments)    Affects heart  . Penicillins Swelling   Previous Medications   CHANTIX 1 MG TABLET    TAKE ONE TABLET BY MOUTH 2 TIMES A DAY   CHOLECALCIFEROL (VITAMIN D) 1000 UNITS TABLET    Take 1,000 Units by mouth daily. Every other day.   CLONAZEPAM (KLONOPIN) 1 MG TABLET    Take 1 mg by mouth 2 (two) times daily as needed for anxiety.   DULOXETINE (CYMBALTA) 60 MG CAPSULE    TAKE 1 CAPSULE BY MOUTH ONCE DAILY.   HYDROCHLOROTHIAZIDE (HYDRODIURIL) 25 MG TABLET    Take 1 tablet by mouth daily.   LAMOTRIGINE (LAMICTAL) 25 MG TABLET    Take by mouth daily.   LANSOPRAZOLE (PREVACID) 15 MG CAPSULE    Take 1 capsule (15 mg total) by mouth daily at 12 noon.   METOPROLOL TARTRATE (LOPRESSOR) 50 MG TABLET    Take 1 tablet (50 mg total) by mouth 2 (two) times daily.   VARENICLINE (CHANTIX) 0.5 MG TABLET    Take 1 tablet (0.5 mg total) by mouth 2 (two) times daily. For the first three days, take one tablet    Review of Systems  All other systems reviewed and are negative.   Social History  Substance Use Topics  . Smoking status: Current Every Day Smoker    Packs/day: 0.50    Years: 30.00    Types: Cigarettes  . Smokeless tobacco: Never Used  . Alcohol use Yes     Comment: once every few months   Objective:   BP 123/80   Pulse 80   Temp 97 F (36.1 C)   Ht 5\' 3"  (1.6 m)   Wt 234 lb 9.6 oz (106.4 kg)   LMP 12/12/2016 (Within Days)   BMI 41.56 kg/m   Physical Exam  Constitutional:  She is oriented to person, place, and time. She appears well-developed and well-nourished.  HENT:  Head: Normocephalic and atraumatic.  Eyes: Pupils are equal, round, and reactive to light.  Neck: Normal range of motion. Neck supple.  Cardiovascular: Normal rate, regular rhythm and normal heart sounds.   Pulmonary/Chest: Effort normal and breath sounds normal.  Abdominal: Soft. Bowel sounds are normal.  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  Skin: Skin is warm and dry.  Psychiatric: She has a normal mood and affect.  Vitals reviewed.       Assessment & Plan:     Hypertriglyceridemia On Atorvastatin 10 mg daily Check lipids today     LBP" Seen chiropractor  Hand Pain:  Rheumatoid factor, Sed Rate were normal Uric Acid drawn tonight   [facility]    [facility] [place]

## 2017-01-03 LAB — URIC ACID: Uric Acid: 4.7 mg/dL (ref 2.5–7.1)

## 2017-01-03 LAB — LIPID PANEL
CHOLESTEROL TOTAL: 153 mg/dL (ref 100–199)
Chol/HDL Ratio: 5.5 ratio units — ABNORMAL HIGH (ref 0.0–4.4)
HDL: 28 mg/dL — ABNORMAL LOW (ref >39)
TRIGLYCERIDES: 584 mg/dL — AB (ref 0–149)

## 2017-01-05 ENCOUNTER — Telehealth: Payer: Self-pay

## 2017-01-05 NOTE — Telephone Encounter (Signed)
Called pt with results. PT verbalized understanding. 

## 2017-01-05 NOTE — Telephone Encounter (Signed)
-----   Message from Harle BattiestShannon A McGowan, PA-C sent at 01/04/2017  8:31 PM EST ----- Please have the patient increase her Lipitor to two pills daily and recheck lipids in 6 weeks.

## 2017-01-09 ENCOUNTER — Other Ambulatory Visit: Payer: Self-pay

## 2017-01-31 ENCOUNTER — Other Ambulatory Visit: Payer: Self-pay | Admitting: Adult Health Nurse Practitioner

## 2017-02-07 ENCOUNTER — Ambulatory Visit
Admission: RE | Admit: 2017-02-07 | Discharge: 2017-02-07 | Disposition: A | Discharge status: Home / Self Care | Payer: Self-pay | Source: Ambulatory Visit | Attending: Oncology | Admitting: Oncology

## 2017-02-07 ENCOUNTER — Encounter: Payer: Self-pay | Admitting: *Deleted

## 2017-02-07 ENCOUNTER — Ambulatory Visit: Payer: Self-pay | Attending: Oncology | Admitting: *Deleted

## 2017-02-07 VITALS — BP 136/87 | HR 72 | Temp 95.7°F | Resp 18 | Ht 64.0 in | Wt 225.0 lb

## 2017-02-07 DIAGNOSIS — Z0000 Encounter for general adult medical examination without abnormal findings: Secondary | ICD-10-CM

## 2017-02-07 NOTE — Progress Notes (Signed)
Subjective:     Patient ID: Monica Moody, female   DOB: 05-15-1966, 51 y.o.   MRN: [id number]  HPI   Review of Systems     Objective:   Physical Exam  Pulmonary/Chest: Right breast exhibits no inverted nipple, no mass, no nipple discharge, no skin change and no tenderness. Left breast exhibits no inverted nipple, no mass, no nipple discharge, no skin change and no tenderness. Breasts are asymmetrical.    Right breast larger than the left  Abdominal: There is no splenomegaly or hepatomegaly.  Genitourinary: Rectal exam shows no mass. No labial fusion. There is no rash, tenderness, lesion or injury on the right labia. There is no rash, tenderness, lesion or injury on the left labia. Cervix exhibits no motion tenderness, no discharge and no friability. Right adnexum displays no mass, no tenderness and no fullness. Left adnexum displays no mass, no tenderness and no fullness. No erythema, tenderness or bleeding in the vagina. No foreign body in the vagina. No signs of injury around the vagina. No vaginal discharge found.       Assessment:     51 year old White female returns to [facility] for annual screening.  Clinical breast exam unremarkable.  Taught self breast awareness.  Specimen collected for pap smear without difficulty.  Patient has been screened for eligibility.  She does not have any insurance, Medicare or Medicaid.  She also meets financial eligibility.  Hand-out given on the Affordable Care Act.    Plan:     Screening mammogram ordered.  Specimen for pap smear sent to the lab.  Will follow-up per [facility] protocol.

## 2017-02-07 NOTE — Patient Instructions (Signed)
HPV Test The human papillomavirus (HPV) test is used to look for high-risk types of HPV infection. HPV is a group of about 100 viruses. Many of these viruses cause growths on, in, or around the genitals. Most HPV viruses cause infections that usually go away without treatment. However, HPV types 6, 11, 16, and 18 are considered high-risk types of HPV that can increase your risk of cancer of the cervix or anus if the infection is left untreated. An HPV test identifies the DNA (genetic) strands of the HPV infection, so it is also referred to as the HPV DNA test. Although HPV is found in both males and females, the HPV test is only used to screen for increased cancer risk in females:  With an abnormal Pap test.  After treatment of an abnormal Pap test.  Between the ages of 30 and 65.  After treatment of a high-risk HPV infection. The HPV test may be done at the same time as a pelvic exam and Pap test in females over the age of 30. Both the HPV test and Pap test require a sample of cells from the cervix. How do I prepare for this test?  Do not douche or take a bath for 24-48 hours before the test or as directed by your health care provider.  Do not have sex for 24-48 hours before the test or as directed by your health care provider.  You may be asked to reschedule the test if you are menstruating.  You will be asked to urinate before the test. What do the results mean? It is your responsibility to obtain your test results. Ask the lab or department performing the test when and how you will get your results. Talk with your health care provider if you have any questions about your results. Your result will be negative or positive. Meaning of Negative Test Results  A negative HPV test result means that no HPV was found, and it is very likely that you do not have HPV. Meaning of Positive Test Results  A positive HPV test result indicates that you have HPV.  If your test result shows the presence  of any high-risk HPV strains, you may have an increased risk of developing cancer of the cervix or anus if the infection is left untreated.  If any low-risk HPV strains are found, you are not likely to have an increased risk of cancer. Discuss your test results with your health care provider. He or she will use the results to make a diagnosis and determine a treatment plan that is right for you. Talk with your health care provider to discuss your results, treatment options, and if necessary, the need for more tests. Talk with your health care provider if you have any questions about your results. This information is not intended to replace advice given to you by your health care provider. Make sure you discuss any questions you have with your health care provider. Document Released: 11/10/2004 Document Revised: 06/21/2016 Document Reviewed: 03/03/2014 Elsevier Interactive Patient Education  2017 Elsevier Inc.  Gave patient hand-out, Women Staying Healthy, Active and Well from [facility], with education on breast health, pap smears, heart and colon health.  

## 2017-02-09 LAB — PAP LB AND HPV HIGH-RISK
HPV, HIGH-RISK: NEGATIVE
PAP Smear Comment: 0

## 2017-02-14 ENCOUNTER — Other Ambulatory Visit: Payer: Self-pay

## 2017-02-14 DIAGNOSIS — E7800 Pure hypercholesterolemia, unspecified: Secondary | ICD-10-CM

## 2017-02-15 ENCOUNTER — Other Ambulatory Visit: Payer: Self-pay | Admitting: Urology

## 2017-02-15 LAB — LIPID PANEL
Chol/HDL Ratio: 4.4 ratio (ref 0.0–4.4)
Cholesterol, Total: 123 mg/dL (ref 100–199)
HDL: 28 mg/dL — AB (ref >39)
LDL CALC: 17 mg/dL (ref 0–99)
Triglycerides: 388 mg/dL — ABNORMAL HIGH (ref 0–149)
VLDL CHOLESTEROL CAL: 78 mg/dL — AB (ref 5–40)

## 2017-02-15 MED ORDER — ATORVASTATIN CALCIUM 20 MG PO TABS: 20.0000 mg | ORAL_TABLET | Freq: Every day | ORAL | 0 refills | Status: DC

## 2017-02-16 ENCOUNTER — Telehealth: Payer: Self-pay

## 2017-02-16 NOTE — Telephone Encounter (Signed)
-----   Message from Harle Battiest, PA-C sent at 02/15/2017  7:57 PM EDT ----- Patient's lipids are improved.  Continue Lipitor 20 mg daily.  RTC in 6 months.

## 2017-02-16 NOTE — Telephone Encounter (Signed)
Called pt with results. PT verbalized understanding. 

## 2017-02-20 ENCOUNTER — Encounter: Payer: Self-pay | Admitting: *Deleted

## 2017-02-20 NOTE — Progress Notes (Signed)
Letter mailed from [facility] to inform patient of her normal mammogram results.  Patient is to follow-up with annual screening in one year.  HSIS to Christy. 

## 2017-03-06 ENCOUNTER — Telehealth: Payer: Self-pay | Admitting: Nurse Practitioner

## 2017-03-06 NOTE — Telephone Encounter (Signed)
Wants to see dr for possible UTI

## 2017-03-07 NOTE — Telephone Encounter (Signed)
Made pt appt

## 2017-03-08 ENCOUNTER — Telehealth: Payer: Self-pay | Admitting: Pharmacist

## 2017-03-08 NOTE — Telephone Encounter (Signed)
03/08/17 Called [employer] for refill on Chantix 1mg .

## 2017-03-15 ENCOUNTER — Ambulatory Visit: Payer: Self-pay

## 2017-03-27 ENCOUNTER — Telehealth: Payer: Self-pay | Admitting: Pharmacist

## 2017-03-27 NOTE — Telephone Encounter (Signed)
[facility] till March [facility].Forde RadonAJ

## 2017-04-03 ENCOUNTER — Ambulatory Visit: Payer: Self-pay

## 2017-04-19 ENCOUNTER — Ambulatory Visit: Payer: Self-pay | Admitting: Urology

## 2017-04-19 DIAGNOSIS — I10 Essential (primary) hypertension: Secondary | ICD-10-CM

## 2017-04-19 MED ORDER — LANSOPRAZOLE 15 MG PO CPDR: 15.0000 mg | DELAYED_RELEASE_CAPSULE | Freq: Every day | ORAL | 3 refills | Status: DC

## 2017-04-19 MED ORDER — HYDROCHLOROTHIAZIDE 25 MG PO TABS: 25.0000 mg | ORAL_TABLET | Freq: Every day | ORAL | 1 refills | Status: DC

## 2017-04-19 NOTE — Progress Notes (Signed)
  Patient: Monica Moody Female    DOB: 02/14/1966   51 y.o.   MRN: [id number] Visit Date: 04/19/2017  Today's Provider: [facility]   Chief Complaint  Patient presents with  . Follow-up   Subjective:    HPI Patient following up for UTI.  She states she was having pain and pressure with frequency and dribbling.  She started drinking cranberry juice and symptoms have abated.    Her last period was in March.  She is experiencing hot flashes.  She does not want to use hormones.      Allergies  Allergen Reactions  . Bupropion Other (See Comments)    Hands peel  . Celexa [Citalopram Hydrobromide] Other (See Comments)    Affects heart  . Penicillins Swelling   Previous Medications   ATORVASTATIN (LIPITOR) 20 MG TABLET    Take 1 tablet (20 mg total) by mouth daily.   CHANTIX 1 MG TABLET    TAKE ONE TABLET BY MOUTH 2 TIMES A DAY   CHOLECALCIFEROL (VITAMIN D) 1000 UNITS TABLET    Take 1,000 Units by mouth daily. Every other day.   CLONAZEPAM (KLONOPIN) 1 MG TABLET    Take 1 mg by mouth 2 (two) times daily as needed for anxiety.   DULOXETINE (CYMBALTA) 60 MG CAPSULE    TAKE 1 CAPSULE BY MOUTH ONCE DAILY.   FLUVOXAMINE (LUVOX) 50 MG TABLET    Take 50 mg by mouth at bedtime.   HYDROCHLOROTHIAZIDE (HYDRODIURIL) 25 MG TABLET    Take 1 tablet by mouth daily.   LAMOTRIGINE (LAMICTAL) 25 MG TABLET    Take by mouth daily.   LANSOPRAZOLE (PREVACID) 15 MG CAPSULE    Take 1 capsule (15 mg total) by mouth daily at 12 noon.   METOPROLOL TARTRATE (LOPRESSOR) 50 MG TABLET    Take 1 tablet (50 mg total) by mouth 2 (two) times daily.   VARENICLINE (CHANTIX) 0.5 MG TABLET    Take 1 tablet (0.5 mg total) by mouth 2 (two) times daily. For the first three days, take one tablet    Review of Systems  Social History  Substance Use Topics  . Smoking status: Current Every Day Smoker    Packs/day: 0.50    Years: 30.00    Types: Cigarettes  . Smokeless tobacco: Never Used  . Alcohol use Yes   Comment: once every few months   Objective:   BP 129/80   Pulse 82   Temp 98.2 F (36.8 C)   Wt 241 lb 11.2 oz (109.6 kg)   LMP 01/17/2017   BMI 41.49 kg/m   Physical Exam Molluscum contagiosum on legs     Assessment & Plan:     1. Hot flashes  - patient will start primose oil and Vitamin E as she does not want to use hormones  2. Perimenopausal symptoms.    - see above  3. Molluscum contagiosum  - patient reassurred   [facility]   [facility] [place]

## 2017-05-10 ENCOUNTER — Telehealth: Payer: Self-pay | Admitting: Pharmacist

## 2017-05-10 NOTE — Telephone Encounter (Signed)
05/10/17 Called [employer] and placed refill for Chantix 1mg .Monica Moody

## 2017-05-18 ENCOUNTER — Other Ambulatory Visit: Payer: Self-pay | Admitting: Urology

## 2017-05-21 ENCOUNTER — Other Ambulatory Visit: Payer: Self-pay | Admitting: Internal Medicine

## 2017-05-28 ENCOUNTER — Other Ambulatory Visit: Payer: Self-pay | Admitting: Urology

## 2017-05-28 MED ORDER — LANSOPRAZOLE 30 MG PO TBDP: 30.0000 mg | ORAL_TABLET | Freq: Every day | ORAL | 3 refills | Status: DC

## 2017-06-21 ENCOUNTER — Other Ambulatory Visit: Payer: Self-pay

## 2017-06-21 DIAGNOSIS — I10 Essential (primary) hypertension: Secondary | ICD-10-CM

## 2017-06-21 DIAGNOSIS — K219 Gastro-esophageal reflux disease without esophagitis: Secondary | ICD-10-CM

## 2017-06-22 LAB — COMPREHENSIVE METABOLIC PANEL
A/G RATIO: 1.8 (ref 1.2–2.2)
ALK PHOS: 58 IU/L (ref 39–117)
ALT: 13 IU/L (ref 0–32)
AST: 18 IU/L (ref 0–40)
Albumin: 4.3 g/dL (ref 3.5–5.5)
BILIRUBIN TOTAL: 0.3 mg/dL (ref 0.0–1.2)
BUN/Creatinine Ratio: 21 (ref 9–23)
BUN: 13 mg/dL (ref 6–24)
CHLORIDE: 94 mmol/L — AB (ref 96–106)
CO2: 25 mmol/L (ref 20–29)
Calcium: 9.4 mg/dL (ref 8.7–10.2)
Creatinine, Ser: 0.62 mg/dL (ref 0.57–1.00)
GFR calc Af Amer: 122 mL/min/{1.73_m2} (ref >59)
GFR, EST NON AFRICAN AMERICAN: 105 mL/min/{1.73_m2} (ref >59)
GLOBULIN, TOTAL: 2.4 g/dL (ref 1.5–4.5)
Glucose: 74 mg/dL (ref 65–99)
POTASSIUM: 3.5 mmol/L (ref 3.5–5.2)
SODIUM: 137 mmol/L (ref 134–144)
Total Protein: 6.7 g/dL (ref 6.0–8.5)

## 2017-06-22 LAB — HEMOGLOBIN A1C
ESTIMATED AVERAGE GLUCOSE: 108 mg/dL
HEMOGLOBIN A1C: 5.4 % (ref 4.8–5.6)

## 2017-06-22 LAB — TSH: TSH: 3.52 u[IU]/mL (ref 0.450–4.500)

## 2017-06-22 LAB — CBC
HEMATOCRIT: 40.1 % (ref 34.0–46.6)
Hemoglobin: 13.7 g/dL (ref 11.1–15.9)
MCH: 29.3 pg (ref 26.6–33.0)
MCHC: 34.2 g/dL (ref 31.5–35.7)
MCV: 86 fL (ref 79–97)
PLATELETS: 248 10*3/uL (ref 150–379)
RBC: 4.68 x10E6/uL (ref 3.77–5.28)
RDW: 14.6 % (ref 12.3–15.4)
WBC: 7.7 10*3/uL (ref 3.4–10.8)

## 2017-06-28 ENCOUNTER — Ambulatory Visit: Payer: Self-pay | Admitting: Adult Health Nurse Practitioner

## 2017-06-28 VITALS — BP 145/91 | HR 85 | Temp 98.5°F | Wt 236.1 lb

## 2017-06-28 DIAGNOSIS — I10 Essential (primary) hypertension: Secondary | ICD-10-CM

## 2017-06-28 DIAGNOSIS — E781 Pure hyperglyceridemia: Secondary | ICD-10-CM

## 2017-06-28 DIAGNOSIS — K219 Gastro-esophageal reflux disease without esophagitis: Secondary | ICD-10-CM

## 2017-06-28 MED ORDER — HYDROCHLOROTHIAZIDE 25 MG PO TABS: 25.0000 mg | ORAL_TABLET | Freq: Every day | ORAL | 1 refills | Status: DC

## 2017-06-28 MED ORDER — ATORVASTATIN CALCIUM 20 MG PO TABS: 20.0000 mg | ORAL_TABLET | Freq: Every day | ORAL | 1 refills | Status: DC

## 2017-06-28 MED ORDER — LANSOPRAZOLE 15 MG PO CPDR: 15.0000 mg | DELAYED_RELEASE_CAPSULE | Freq: Every day | ORAL | 3 refills | Status: DC

## 2017-06-28 MED ORDER — METOPROLOL TARTRATE 50 MG PO TABS: 50.0000 mg | ORAL_TABLET | Freq: Two times a day (BID) | ORAL | 4 refills | Status: DC

## 2017-06-28 NOTE — Progress Notes (Signed)
  Patient: Monica Moody Female    DOB: 03/28/1966   51 y.o.   MRN: [id number] Visit Date: 06/28/2017  Today's Provider: Jacelyn Pieah Doles-Johnson, NP   Chief Complaint  Patient presents with  . Follow-up   Subjective:    HPI   BP elevated tonight.  Denies CP, dizziness or HA.  Taking medications as directed.  Last visit BP was 129/80.     Allergies  Allergen Reactions  . Bupropion Other (See Comments)    Hands peel  . Celexa [Citalopram Hydrobromide] Other (See Comments)    Affects heart  . Penicillins Swelling   Previous Medications   ATORVASTATIN (LIPITOR) 20 MG TABLET    TAKE ONE TABLET BY MOUTH EVERY DAY   CHANTIX 1 MG TABLET    TAKE ONE TABLET BY MOUTH 2 TIMES A DAY   CHOLECALCIFEROL (VITAMIN D) 1000 UNITS TABLET    Take 1,000 Units by mouth daily. Every other day.   DULOXETINE (CYMBALTA) 60 MG CAPSULE    TAKE 1 CAPSULE BY MOUTH ONCE DAILY.   FLUVOXAMINE (LUVOX) 50 MG TABLET    Take 50 mg by mouth at bedtime.   HYDROCHLOROTHIAZIDE (HYDRODIURIL) 25 MG TABLET    Take 1 tablet (25 mg total) by mouth daily.   LAMOTRIGINE (LAMICTAL) 25 MG TABLET    Take by mouth daily.   LANSOPRAZOLE (PREVACID SOLUTAB) 30 MG DISINTEGRATING TABLET    Take 1 tablet (30 mg total) by mouth daily.   METOPROLOL TARTRATE (LOPRESSOR) 50 MG TABLET    Take 1 tablet (50 mg total) by mouth 2 (two) times daily.   VARENICLINE (CHANTIX) 0.5 MG TABLET    Take 1 tablet (0.5 mg total) by mouth 2 (two) times daily. For the first three days, take one tablet    Review of Systems  All other systems reviewed and are negative.   Social History  Substance Use Topics  . Smoking status: Current Every Day Smoker    Packs/day: 0.50    Years: 30.00    Types: Cigarettes  . Smokeless tobacco: Never Used  . Alcohol use Yes     Comment: once every few months   Objective:   BP (!) 145/91   Pulse 85   Temp 98.5 F (36.9 C)   Wt 236 lb 1.6 oz (107.1 kg)   LMP 01/11/2017   BMI 40.53 kg/m   Physical Exam   Constitutional: She is oriented to person, place, and time. She appears well-developed and well-nourished.  Cardiovascular: Normal rate, regular rhythm, normal heart sounds and intact distal pulses.   Pulmonary/Chest: Effort normal and breath sounds normal.  Abdominal: Soft. Bowel sounds are normal.  Neurological: She is alert and oriented to person, place, and time.  Skin: Skin is warm and dry.  Vitals reviewed.      Repeat BP 118/80 Assessment & Plan:       HTN:  Controlled.  Goal BP <140/90.  Continue current medication regimen.  Encourage low salt diet and exercise.   HLD:  Lipid panel today.  Continue current regimen.  Encourage low cholesterol, low fat diet and exercise.   GERD:  Continue current regimen.    Labs reviewed.        Jacelyn Pieah Doles-Johnson, NP   [facility] [place] [place]

## 2017-06-29 LAB — LIPID PANEL
CHOL/HDL RATIO: 5.9 ratio — AB (ref 0.0–4.4)
CHOLESTEROL TOTAL: 166 mg/dL (ref 100–199)
HDL: 28 mg/dL — AB (ref >39)
TRIGLYCERIDES: 655 mg/dL — AB (ref 0–149)

## 2017-07-03 ENCOUNTER — Other Ambulatory Visit: Payer: Self-pay | Admitting: Adult Health Nurse Practitioner

## 2017-07-03 MED ORDER — FENOFIBRATE 145 MG PO TABS: 145.0000 mg | ORAL_TABLET | Freq: Every day | ORAL | 1 refills | Status: DC

## 2017-07-04 ENCOUNTER — Telehealth: Payer: Self-pay

## 2017-07-04 DIAGNOSIS — E7800 Pure hypercholesterolemia, unspecified: Secondary | ICD-10-CM

## 2017-07-04 MED ORDER — FENOFIBRATE 145 MG PO TABS
145.0000 mg | ORAL_TABLET | Freq: Every day | ORAL | 1 refills | Status: DC
Start: 2017-07-04 — End: 2017-11-01

## 2017-07-04 NOTE — Telephone Encounter (Signed)
-----   Message from Ezekiel InaLorrie D Carter sent at 07/03/2017  5:51 PM EDT -----   ----- Message ----- From: Jacelyn Pioles-Johnson, Teah, NP Sent: 07/03/2017   5:49 PM To: Ezekiel InaLorrie D Carter  Triglycerides are elevated. Encourage strict low cholesterol diet. Start new rx for tricor 145mg  tablet daily.

## 2017-07-04 NOTE — Telephone Encounter (Signed)
Called pt with results. PT verbalized understanding. [facility].

## 2017-07-17 ENCOUNTER — Telehealth: Payer: Self-pay | Admitting: Pharmacist

## 2017-07-17 NOTE — Telephone Encounter (Signed)
07/17/17 Called [employer] for refill on Chantix .Forde Radon

## 2017-08-16 ENCOUNTER — Emergency Department: Payer: Self-pay

## 2017-08-16 ENCOUNTER — Encounter: Payer: Self-pay | Admitting: Emergency Medicine

## 2017-08-16 ENCOUNTER — Emergency Department
Admission: EM | Admit: 2017-08-16 | Discharge: 2017-08-16 | Disposition: A | Discharge status: Home / Self Care | Payer: Self-pay | Attending: Emergency Medicine | Admitting: Emergency Medicine

## 2017-08-16 DIAGNOSIS — F17210 Nicotine dependence, cigarettes, uncomplicated: Secondary | ICD-10-CM | POA: Insufficient documentation

## 2017-08-16 DIAGNOSIS — Z79899 Other long term (current) drug therapy: Secondary | ICD-10-CM | POA: Insufficient documentation

## 2017-08-16 DIAGNOSIS — I10 Essential (primary) hypertension: Secondary | ICD-10-CM | POA: Insufficient documentation

## 2017-08-16 DIAGNOSIS — M25561 Pain in right knee: Secondary | ICD-10-CM | POA: Insufficient documentation

## 2017-08-16 MED ORDER — KETOROLAC TROMETHAMINE 10 MG PO TABS: 10.0000 mg | ORAL_TABLET | Freq: Four times a day (QID) | ORAL | 0 refills | Status: AC | PRN

## 2017-08-16 MED ORDER — KETOROLAC TROMETHAMINE 30 MG/ML IJ SOLN
30.0000 mg | Freq: Once | INTRAMUSCULAR | Status: AC
  Administered 2017-08-16: 30 mg via INTRAMUSCULAR
  Filled 2017-08-16: qty 1

## 2017-08-16 NOTE — ED Notes (Signed)
Assisted pt into changing into gown. Rt knee is swollen and extremely tender to palpation.

## 2017-08-16 NOTE — ED Triage Notes (Signed)
Pt c/o right knee pain. Went to get up off floor today and something pulled. Pt laid on couch for while and seemed better so got in floor to play with niece and now severe pain and cannot walk. Appears to have swelling but difficulty to assess due to unable to pull pants leg up

## 2017-08-16 NOTE — ED Provider Notes (Signed)
[facility] Emergency Department Provider Note  ____________________________________________  Time seen: Approximately 5:32 PM  I have reviewed the triage vital signs and the nursing notes.   HISTORY  Chief Complaint Knee Pain    HPI Monica Moody is a 51 y.o. female presents to the emergency department with 10 out of 10 right knee pain that occurred when she was playing on the floor with her niece and felt "something pull". Patient denies falls or mechanisms of trauma. She denies a history of right knee pain. She denies weakness, radiculopathy or changes in sensation of the lower extremities. No skin compromise. No alleviating measures have been attempted.   Past Medical History:  Diagnosis Date  . Abscess of chest wall   . Anxiety   . Depression   . GERD (gastroesophageal reflux disease)   . Hyperlipidemia   . Hypertension   . IFG (impaired fasting glucose)   . Obesity   . Palpitations   . Tobacco use     Patient Active Problem List   Diagnosis Date Noted  . Segmental dysfunction of lumbar region 10/18/2016  . Muscle spasm of back 10/18/2016  . Cervical segment dysfunction 10/18/2016  . Abnormal posture 10/18/2016  . Hand pain, right 10/10/2016  . Low back pain 10/10/2016  . Mixed hyperlipidemia 08/22/2016  . BMI 40.0-44.9, adult (HCC) 07/06/2016  . Hypertriglyceridemia 08/31/2015  . Breast cancer screening 08/30/2015  . Vitamin D deficiency 08/30/2015  . Medication monitoring encounter 08/30/2015  . Needs flu shot 08/30/2015  . Essential hypertension   . GERD (gastroesophageal reflux disease)   . Depression   . Anxiety   . Tobacco use   . IFG (impaired fasting glucose)   . Obesity   . Palpitations     Past Surgical History:  Procedure Laterality Date  . BREAST BIOPSY Left    surgical bx  . BREAST LUMPECTOMY Left   . mesh sling implated     due to urethra dropping  . OVARIAN CYST REMOVAL Right   . TUBAL LIGATION    . Uterine  Ablation      Prior to Admission medications   Medication Sig Start Date End Date Taking? Authorizing Provider  atorvastatin (LIPITOR) 20 MG tablet Take 1 tablet (20 mg total) by mouth daily. 06/28/17   Doles-Johnson, Teah, NP  CHANTIX 1 MG tablet TAKE ONE TABLET BY MOUTH 2 TIMES A DAY 11/23/16   McGowan, Carollee Herter A, PA-C  cholecalciferol (VITAMIN D) 1000 UNITS tablet Take 1,000 Units by mouth daily. Every other day.    [provider]  DULoxetine (CYMBALTA) 60 MG capsule TAKE 1 CAPSULE BY MOUTH ONCE DAILY. 05/17/16   Particia Nearing, PA-C  fenofibrate (TRICOR) 145 MG tablet Take 1 tablet (145 mg total) by mouth daily. 07/04/17   Doles-Johnson, Teah, NP  fluvoxaMINE (LUVOX) 50 MG tablet Take 50 mg by mouth at bedtime.    [provider]  hydrochlorothiazide (HYDRODIURIL) 25 MG tablet Take 1 tablet (25 mg total) by mouth daily. 06/28/17   Doles-Johnson, Teah, NP  ketorolac (TORADOL) 10 MG tablet Take 1 tablet (10 mg total) by mouth every 6 (six) hours as needed. 08/16/17 08/21/17  Orvil Feil, PA-C  lamoTRIgine (LAMICTAL) 25 MG tablet Take by mouth daily.    [provider]  lansoprazole (PREVACID) 15 MG capsule Take 1 capsule (15 mg total) by mouth daily at 12 noon. 06/28/17   Doles-Johnson, Teah, NP  metoprolol tartrate (LOPRESSOR) 50 MG tablet Take 1 tablet (  50 mg total) by mouth 2 (two) times daily. 06/28/17   Doles-Johnson, Teah, NP  varenicline (CHANTIX) 0.5 MG tablet Take 1 tablet (0.5 mg total) by mouth 2 (two) times daily. For the first three days, take one tablet 06/01/16   McGowan, Carollee Herter A, PA-C    Allergies Bupropion; Celexa [citalopram hydrobromide]; and Penicillins  Family History  Problem Relation Age of Onset  . Arthritis Mother   . Diabetes Mother   . Heart disease Mother   . Hyperlipidemia Mother   . Hypertension Mother   . Migraines Mother   . Cancer Mother        breast  . Diabetes Father   . Hyperlipidemia Father   . Hypertension  Father   . Cancer Father        kidney  . Migraines Brother   . Cancer Maternal Aunt        ovarian  . Cancer Paternal Uncle        intestines  . Cancer Maternal Grandmother        stomach  . COPD Maternal Grandfather   . Stroke Neg Hx     Social History Social History  Substance Use Topics  . Smoking status: Current Every Day Smoker    Packs/day: 0.50    Years: 30.00    Types: Cigarettes  . Smokeless tobacco: Never Used  . Alcohol use Yes     Comment: once every few months     Review of Systems  Constitutional: No fever/chills Eyes: No visual changes. No discharge ENT: No upper respiratory complaints. Cardiovascular: no chest pain. Respiratory: no cough. No SOB. Musculoskeletal: Patient has right knee pain.  Skin: Negative for rash, abrasions, lacerations, ecchymosis. Neurological: Negative for headaches, focal weakness or numbness.   ____________________________________________   PHYSICAL EXAM:  VITAL SIGNS: ED Triage Vitals  Enc Vitals Group     BP 08/16/17 1629 136/82     Pulse Rate 08/16/17 1629 87     Resp 08/16/17 1629 18     Temp 08/16/17 1629 98.5 F (36.9 C)     Temp Source 08/16/17 1629 Oral     SpO2 08/16/17 1629 96 %     Weight 08/16/17 1630 225 lb (102.1 kg)     Height 08/16/17 1630 5\' 3"  (1.6 m)     Head Circumference --      Peak Flow --      Pain Score 08/16/17 1629 8     Pain Loc --      Pain Edu? --      Excl. in GC? --      Constitutional: Alert and oriented. Well appearing and in no acute distress. Eyes: Conjunctivae are normal. PERRL. EOMI. Head: Atraumatic. Cardiovascular: Normal rate, regular rhythm. Normal S1 and S2.  Good peripheral circulation. Respiratory: Normal respiratory effort without tachypnea or retractions. Lungs CTAB. Good air entry to the bases with no decreased or absent breath sounds. Musculoskeletal: Patient performs full range of motion at the right knee, likely secondary to pain. Patient performs full  range of motion at the right ankle and right hip. Right Knee: Negative ballottement. Negative anterior posterior drawer test. No laxity with MCL or LCL testing. Palpable dorsalis pedis pulse, right. Neurologic:  Normal speech and language. No gross focal neurologic deficits are appreciated.  Skin:  Skin is warm, dry and intact. No rash noted. Psychiatric: Mood and affect are normal. Speech and behavior are normal. Patient exhibits appropriate insight and judgement.   ____________________________________________  LABS (all labs ordered are listed, but only abnormal results are displayed)  Labs Reviewed - No data to display ____________________________________________  EKG   ____________________________________________  RADIOLOGY Geraldo PitterI, Zakya Halabi M Laria Grimmett, personally viewed and evaluated these images (plain radiographs) as part of my medical decision making, as well as reviewing the written report by the radiologist.  Dg Knee Complete 4 Views Right  Result Date: 08/16/2017 CLINICAL DATA:  Pain and swelling of the right knee EXAM: RIGHT KNEE - COMPLETE 4+ VIEW COMPARISON:  None. FINDINGS: No evidence of fracture, dislocation, or joint effusion. No evidence of arthropathy or other focal bone abnormality. Soft tissues are unremarkable. IMPRESSION: Negative. Electronically Signed   By: Deatra RobinsonKevin  Herman M.D.   On: 08/16/2017 16:57    ____________________________________________    PROCEDURES  Procedure(s) performed:    Procedures    Medications  ketorolac (TORADOL) 30 MG/ML injection 30 mg (not administered)     ____________________________________________   INITIAL IMPRESSION / ASSESSMENT AND PLAN / ED COURSE  Pertinent labs & imaging results that were available during my care of the patient were reviewed by me and considered in my medical decision making (see chart for details).  Review of the [place] CSRS was performed in accordance of the [facility] prior to dispensing any controlled  drugs.     Assessment and Plan: Right knee pain Patient presents to the emergency department with 10 out of 10 right knee pain. X-ray examination reveals no acute fractures or bony abnormalities. Patient was placed in a knee immobilizer in the emergency department. Toradol was given for pain. Patient was discharged with Toradol and a referral was made to orthopedics. Vital signs are reassuring prior to discharge. All patient questions were answered.   ____________________________________________  FINAL CLINICAL IMPRESSION(S) / ED DIAGNOSES  Final diagnoses:  Acute pain of right knee      NEW MEDICATIONS STARTED DURING THIS VISIT:  New Prescriptions   KETOROLAC (TORADOL) 10 MG TABLET    Take 1 tablet (10 mg total) by mouth every 6 (six) hours as needed.        This chart was dictated using voice recognition software/Dragon. Despite best efforts to proofread, errors can occur which can change the meaning. Any change was purely unintentional.    Orvil FeilWoods, Ceasar Decandia M, PA-C 08/16/17 1741    Merrily Brittleifenbark, Neil, MD 08/16/17 [phone]

## 2017-09-19 ENCOUNTER — Telehealth: Payer: Self-pay

## 2017-09-19 NOTE — Telephone Encounter (Signed)
Placed signed application/script in [facility] folder for pickup. 

## 2017-09-19 NOTE — Telephone Encounter (Signed)
Received PAP application from [facility] for chantix placed for provider to sign.

## 2017-09-27 ENCOUNTER — Telehealth: Payer: Self-pay | Admitting: Pharmacist

## 2017-09-27 NOTE — Telephone Encounter (Signed)
09/27/17 Received provider portion back for Chantix 1mg  renewal with [employer]. Holding for patient to return her portion.Forde RadonAJ

## 2017-11-01 ENCOUNTER — Ambulatory Visit: Payer: Self-pay | Admitting: Adult Health Nurse Practitioner

## 2017-11-01 VITALS — BP 130/82 | HR 80 | Temp 98.6°F | Wt 214.3 lb

## 2017-11-01 DIAGNOSIS — E781 Pure hyperglyceridemia: Secondary | ICD-10-CM

## 2017-11-01 DIAGNOSIS — K219 Gastro-esophageal reflux disease without esophagitis: Secondary | ICD-10-CM

## 2017-11-01 DIAGNOSIS — E782 Mixed hyperlipidemia: Secondary | ICD-10-CM

## 2017-11-01 DIAGNOSIS — I10 Essential (primary) hypertension: Secondary | ICD-10-CM

## 2017-11-01 DIAGNOSIS — E7800 Pure hypercholesterolemia, unspecified: Secondary | ICD-10-CM

## 2017-11-01 MED ORDER — METOPROLOL TARTRATE 50 MG PO TABS: 50.0000 mg | ORAL_TABLET | Freq: Two times a day (BID) | ORAL | 4 refills | Status: DC

## 2017-11-01 MED ORDER — HYDROCHLOROTHIAZIDE 25 MG PO TABS: 25.0000 mg | ORAL_TABLET | Freq: Every day | ORAL | 1 refills | Status: DC

## 2017-11-01 MED ORDER — FENOFIBRATE 145 MG PO TABS: 145.0000 mg | ORAL_TABLET | Freq: Every day | ORAL | 1 refills | Status: DC

## 2017-11-01 MED ORDER — LANSOPRAZOLE 15 MG PO CPDR: 15.0000 mg | DELAYED_RELEASE_CAPSULE | Freq: Every day | ORAL | 3 refills | Status: DC

## 2017-11-01 NOTE — Progress Notes (Signed)
  Patient: Pollyann Savoymy F Voorhies Female    DOB: 09/10/1966   52 y.o.   MRN: [id number] Visit Date: 11/01/2017  Today's Provider: Jacelyn Pieah Doles-Johnson, NP   Chief Complaint  Patient presents with  . Follow-up    needs refill on medications   Subjective:    HPI   Last Trig in Aug was 655.Last LDL was 17.   Off Lipitor. Taking fenofibrate.  Keto diet.   Denies dizziness, CP, HA.  Taking medications as directed.  GERD well controlled with current regimen,       Allergies  Allergen Reactions  . Bupropion Other (See Comments)    Hands peel  . Celexa [Citalopram Hydrobromide] Other (See Comments)    Affects heart  . Penicillins Swelling   This SmartLink is deprecated. Use AVSMEDLIST instead to display the medication list for a patient.  Review of Systems  Social History   Tobacco Use  . Smoking status: Current Every Day Smoker    Packs/day: 0.50    Years: 30.00    Pack years: 15.00    Types: Cigarettes  . Smokeless tobacco: Never Used  Substance Use Topics  . Alcohol use: Yes    Comment: once every few months   Objective:   BP 130/82 (BP Location: Right Arm, Patient Position: Sitting, Cuff Size: Large)   Pulse 80   Temp 98.6 F (37 C) (Oral)   Wt 214 lb 4.8 oz (97.2 kg)   LMP 10/03/2017 (Approximate)   BMI 37.96 kg/m   Physical Exam  Constitutional: She is oriented to person, place, and time. She appears well-developed and well-nourished.  HENT:  Head: Normocephalic and atraumatic.  Eyes: Pupils are equal, round, and reactive to light.  Neck: Normal range of motion. Neck supple.  Cardiovascular: Normal rate, regular rhythm, normal heart sounds and intact distal pulses.  Pulmonary/Chest: Effort normal and breath sounds normal.  Abdominal: Soft. Bowel sounds are normal.  Neurological: She is alert and oriented to person, place, and time.  Skin: Skin is warm and dry.  Vitals reviewed.       Assessment & Plan:         GERD Continue current regimen.  Avoid  triggers.   HTN:  Controlled. .  Goal BP <140/90.  Continue current medication regimen.  Encourage low salt diet and exercise.   HLD/Hypertriglyceridemia:  Controlled.   Continue fenofibrate.  Encourage low cholesterol, low fat diet and exercise.     Jacelyn Pieah Doles-Johnson, NP   [facility] [place]

## 2017-11-02 ENCOUNTER — Telehealth: Payer: Self-pay | Admitting: Pharmacist

## 2017-11-02 LAB — COMPREHENSIVE METABOLIC PANEL WITH GFR
ALT: 13 IU/L (ref 0–32)
AST: 14 IU/L (ref 0–40)
Albumin/Globulin Ratio: 1.7 (ref 1.2–2.2)
Albumin: 4.2 g/dL (ref 3.5–5.5)
Alkaline Phosphatase: 41 IU/L (ref 39–117)
BUN/Creatinine Ratio: 33 — ABNORMAL HIGH (ref 9–23)
BUN: 18 mg/dL (ref 6–24)
Bilirubin Total: 0.2 mg/dL (ref 0.0–1.2)
CO2: 25 mmol/L (ref 20–29)
Calcium: 9.7 mg/dL (ref 8.7–10.2)
Chloride: 98 mmol/L (ref 96–106)
Creatinine, Ser: 0.55 mg/dL — ABNORMAL LOW (ref 0.57–1.00)
GFR calc Af Amer: 126 mL/min/1.73
GFR calc non Af Amer: 110 mL/min/1.73
Globulin, Total: 2.5 g/dL (ref 1.5–4.5)
Glucose: 80 mg/dL (ref 65–99)
Potassium: 3.7 mmol/L (ref 3.5–5.2)
Sodium: 141 mmol/L (ref 134–144)
Total Protein: 6.7 g/dL (ref 6.0–8.5)

## 2017-11-02 LAB — LIPID PANEL
Chol/HDL Ratio: 7.1 ratio — ABNORMAL HIGH (ref 0.0–4.4)
Cholesterol, Total: 193 mg/dL (ref 100–199)
HDL: 27 mg/dL — ABNORMAL LOW
Triglycerides: 706 mg/dL (ref 0–149)

## 2017-11-02 LAB — LDL CHOLESTEROL, DIRECT: LDL DIRECT: 71 mg/dL (ref 0–99)

## 2017-11-02 NOTE — Telephone Encounter (Signed)
Telephone Call Encounter selected in error.  Ivanka Kirshner K. Joelene MillinHarrison, BS, PharmD [facility]-Pharmacy Operations Coordinator [phone]

## 2017-11-06 ENCOUNTER — Telehealth: Payer: Self-pay | Admitting: Pharmacist

## 2017-11-06 NOTE — Telephone Encounter (Signed)
11/06/17 I had mailed letter and [employer] application to patient 09/10/17 for patient to sign & return, patient has not returned paperwork. [facility] and according to a visit 11/01/17, patient stated not taking. I have closed this med. will not be ordering.Forde RadonAJ

## 2017-11-08 ENCOUNTER — Telehealth: Payer: Self-pay

## 2017-11-08 NOTE — Telephone Encounter (Signed)
Spoke with pt regarding lab results. Gave instructions to continue Fenofibrate, strict low cholesterol diet is important as well as exercise

## 2017-11-08 NOTE — Telephone Encounter (Signed)
-----   Message from Ezekiel InaLorrie D Carter sent at 11/07/2017  3:39 PM EST ----- Elizebeth Brookingambra, Please call patient and make them aware of their results and Teah's suggestions. ----- Message ----- From: Jacelyn Pioles-Johnson, Teah, NP Sent: 11/06/2017   7:18 PM To: Ezekiel InaLorrie D Carter  Triglycerides are still elevated at 706. Continue Fenofibrate, strict low cholesterol diet is important as well as exercise.

## 2018-01-07 ENCOUNTER — Telehealth: Payer: Self-pay | Admitting: Pharmacist

## 2018-01-07 NOTE — Telephone Encounter (Signed)
01/07/2018 12:07:56 PM - Cymbalta 60  01/07/18 Received pharmacy printout for Cymbalta 60mg  Take one capsule by mouth once daily,(replaces Duloxetine HCL) will take provider Dr. Geannie RisenBuck Braden his portion to sign, put patient portion in bag to be given to patient when she picks up the meds in bag. Forde RadonAJ

## 2018-03-05 ENCOUNTER — Ambulatory Visit: Payer: Self-pay | Admitting: Family Medicine

## 2018-03-05 VITALS — BP 128/85 | HR 88 | Temp 98.0°F | Wt 195.6 lb

## 2018-03-05 DIAGNOSIS — IMO0001 Reserved for inherently not codable concepts without codable children: Secondary | ICD-10-CM

## 2018-03-05 DIAGNOSIS — L0293 Carbuncle, unspecified: Secondary | ICD-10-CM

## 2018-03-05 DIAGNOSIS — I10 Essential (primary) hypertension: Secondary | ICD-10-CM

## 2018-03-05 DIAGNOSIS — M25561 Pain in right knee: Secondary | ICD-10-CM

## 2018-03-05 DIAGNOSIS — L0292 Furuncle, unspecified: Secondary | ICD-10-CM

## 2018-03-05 DIAGNOSIS — G8929 Other chronic pain: Secondary | ICD-10-CM

## 2018-03-05 DIAGNOSIS — E785 Hyperlipidemia, unspecified: Secondary | ICD-10-CM

## 2018-03-05 DIAGNOSIS — Z09 Encounter for follow-up examination after completed treatment for conditions other than malignant neoplasm: Secondary | ICD-10-CM

## 2018-03-05 DIAGNOSIS — Z8481 Family history of carrier of genetic disease: Secondary | ICD-10-CM

## 2018-03-05 DIAGNOSIS — F17200 Nicotine dependence, unspecified, uncomplicated: Secondary | ICD-10-CM

## 2018-03-05 DIAGNOSIS — Z83719 Family history of colon polyps, unspecified: Secondary | ICD-10-CM

## 2018-03-05 DIAGNOSIS — Z8371 Family history of colonic polyps: Secondary | ICD-10-CM

## 2018-03-05 MED ORDER — IBUPROFEN 800 MG PO TABS: 800.0000 mg | ORAL_TABLET | Freq: Three times a day (TID) | ORAL | 1 refills | Status: DC | PRN

## 2018-03-05 MED ORDER — HYDROCHLOROTHIAZIDE 25 MG PO TABS: 25.0000 mg | ORAL_TABLET | Freq: Every day | ORAL | 1 refills | Status: DC

## 2018-03-05 MED ORDER — LANSOPRAZOLE 15 MG PO CPDR: 15.0000 mg | DELAYED_RELEASE_CAPSULE | Freq: Every day | ORAL | 3 refills | Status: DC

## 2018-03-05 MED ORDER — FENOFIBRATE 145 MG PO TABS: 145.0000 mg | ORAL_TABLET | Freq: Every day | ORAL | 1 refills | Status: DC

## 2018-03-05 MED ORDER — METOPROLOL TARTRATE 50 MG PO TABS: 50.0000 mg | ORAL_TABLET | Freq: Two times a day (BID) | ORAL | 4 refills | Status: DC

## 2018-03-05 NOTE — Progress Notes (Addendum)
Patient: Monica Moody Female    DOB: 22-Aug-1966   51 y.o.   MRN: [id number] Visit Date: 03/05/2018  Today's Provider: Kallie Locks, FNP   Chief Complaint  Patient presents with  . Follow-up   Subjective:   HPI   Patient is here today for follow up. She states that she is feeling well and has good energy. She denies any incidents of fevers, chills, any recent infections, weight loss, and night sweats.   She continues to smoke 1/2 pack of cigarettes daily. She denies cough, shortness of breath, and chest pain. She denies dizziness, visual changes, and falls.   C/o boils underneath stomach folds and upper legs for a few months.  She has uses an Rx cream that her husband uses for toe infection, with no relief. States that boils are painful, and red at times. She has a good appetite. She reports that she has normal bowel movements. Denies abdominal pain, nausea, vomiting, diarrhea, and constipation. She denies any incidents of bleeding.   Denies neuropathy. Denies depression and anxiety.   Continues to have pain in right knee from previous strain 07/2018. States that she currently uses Ibuprofen to help relieve pain.    Allergies  Allergen Reactions  . Bupropion Other (See Comments)    Hands peel  . Celexa [Citalopram Hydrobromide] Other (See Comments)    Affects heart  . Penicillins Swelling   Previous Medications   CHOLECALCIFEROL (VITAMIN D) 1000 UNITS TABLET    Take 1,000 Units by mouth daily. Every other day.   DULOXETINE (CYMBALTA) 60 MG CAPSULE    TAKE 1 CAPSULE BY MOUTH ONCE DAILY.   FLUVOXAMINE (LUVOX) 50 MG TABLET    Take 50 mg by mouth at bedtime.   LAMOTRIGINE (LAMICTAL) 25 MG TABLET    Take by mouth daily.   VITAMIN E 100 UNIT CAPSULE    Take by mouth daily.    Review of Systems  HENT: Negative.   Eyes: Negative.   Respiratory: Negative.   Cardiovascular: Negative.   Gastrointestinal: Negative.   Endocrine: Negative.   Genitourinary: Negative.    Musculoskeletal:       Right knee pain  Skin: Negative.   Allergic/Immunologic: Negative.   Neurological: Negative.   Hematological: Negative.   Psychiatric/Behavioral: Negative.     Social History   Tobacco Use  . Smoking status: Current Every Day Smoker    Packs/day: 0.50    Years: 30.00    Pack years: 15.00    Types: Cigarettes  . Smokeless tobacco: Never Used  Substance Use Topics  . Alcohol use: Yes    Comment: once every few months   Objective:   BP 128/85   Pulse 88   Temp 98 F (36.7 C)   Wt 195 lb 9.6 oz (88.7 kg)   BMI 34.65 kg/m   Physical Exam  Constitutional: She is oriented to person, place, and time. She appears well-developed and well-nourished.  HENT:  Head: Normocephalic and atraumatic.  Right Ear: External ear normal.  Left Ear: External ear normal.  Nose: Nose normal.  Mouth/Throat: Oropharynx is clear and moist.  Eyes: Pupils are equal, round, and reactive to light. Conjunctivae and EOM are normal.  Neck: Normal range of motion. Neck supple.  Cardiovascular: Normal rate, regular rhythm, normal heart sounds and intact distal pulses.  Pulmonary/Chest: Effort normal and breath sounds normal.  Abdominal: Soft. Bowel sounds are normal.  Musculoskeletal: She exhibits tenderness.  Right knee pain.   Neurological: She is alert  and oriented to person, place, and time.  Skin: Skin is warm and dry. Capillary refill takes less than 2 seconds.  Psychiatric: She has a normal mood and affect. Her behavior is normal. Judgment and thought content normal.  Nursing note and vitals reviewed.      Assessment & Plan:  1. Essential hypertension BP is stable at128/85. Continue HCTZ and Lopressor as directed. We will re-assess lab values today.  - CBC w/Diff - Comprehensive metabolic panel; Future  2. Hyperlipidemia, unspecified hyperlipidemia type Recent lipid levels on 11/01/2017 showed Cholesterol at normal range of 193, Triglycerides are 706, HDL is low at  [employer] as directed.  - CBC w/Diff - Comprehensive metabolic panel; Future - Lipid Profile  3. Chronic pain of right knee Rx for Ibuprofen 800 mg to pharmacy for right knee pain.   - hydrochlorothiazide (HYDRODIURIL) 25 MG tablet; Take 1 tablet (25 mg total) by mouth daily.  Dispense: 90 tablet; Refill: 1  4. Family history of breast cancer gene mutation in first degree relative Will order Diagnostic Mammogram. Will refer her to [facility] charity care to fund Mammogram.  - fenofibrate (TRICOR) 145 MG tablet; Take 1 tablet (145 mg total) by mouth daily.  Dispense: 90 tablet; Refill: 1  5. Family history of colonic polyps She states that father had history of colon polyps and also her sister. She is not having any signs and symptoms of bleeding. We will obtain an occult stool to assess for blood in stools at next office visit. If possible, we will order Colonoscopy.   6. Recurrent boils She will use Hibiclens daily and Dial Soap (gold bar), to clean skin.   7. Smoking She continues to smoke 1/2 packs of cigarettes daily. She will contact office when she is ready for smoking cessation.   8. Follow up Follow up in 2 weeks for follow up and lab review.     Kallie Locks, FNP   [facility]

## 2018-03-06 ENCOUNTER — Encounter: Payer: Self-pay | Admitting: Family Medicine

## 2018-03-06 LAB — LIPID PANEL
Chol/HDL Ratio: 6.8 ratio — ABNORMAL HIGH (ref 0.0–4.4)
Cholesterol, Total: 177 mg/dL (ref 100–199)
HDL: 26 mg/dL — ABNORMAL LOW (ref >39)
Triglycerides: 568 mg/dL (ref 0–149)

## 2018-03-06 LAB — CBC WITH DIFFERENTIAL/PLATELET
Basophils Absolute: 0 10*3/uL (ref 0.0–0.2)
Basos: 0 %
EOS (ABSOLUTE): 0.1 10*3/uL (ref 0.0–0.4)
Eos: 2 %
Hematocrit: 41.4 % (ref 34.0–46.6)
Hemoglobin: 14 g/dL (ref 11.1–15.9)
Immature Grans (Abs): 0.1 10*3/uL (ref 0.0–0.1)
Immature Granulocytes: 1 %
Lymphocytes Absolute: 1.8 10*3/uL (ref 0.7–3.1)
Lymphs: 22 %
MCH: 29.4 pg (ref 26.6–33.0)
MCHC: 33.8 g/dL (ref 31.5–35.7)
MCV: 87 fL (ref 79–97)
Monocytes Absolute: 0.5 10*3/uL (ref 0.1–0.9)
Monocytes: 6 %
Neutrophils Absolute: 5.5 10*3/uL (ref 1.4–7.0)
Neutrophils: 69 %
Platelets: 306 10*3/uL (ref 150–379)
RBC: 4.77 x10E6/uL (ref 3.77–5.28)
RDW: 13.8 % (ref 12.3–15.4)
WBC: 8 10*3/uL (ref 3.4–10.8)

## 2018-03-11 IMAGING — DX DG KNEE COMPLETE 4+V*R*
4 series · 4 of 4 positions shown · non-contrast
Comparison: None.

CLINICAL DATA: Pain and swelling of the right knee

EXAM:
RIGHT KNEE - COMPLETE 4+ VIEW

[knee ap]
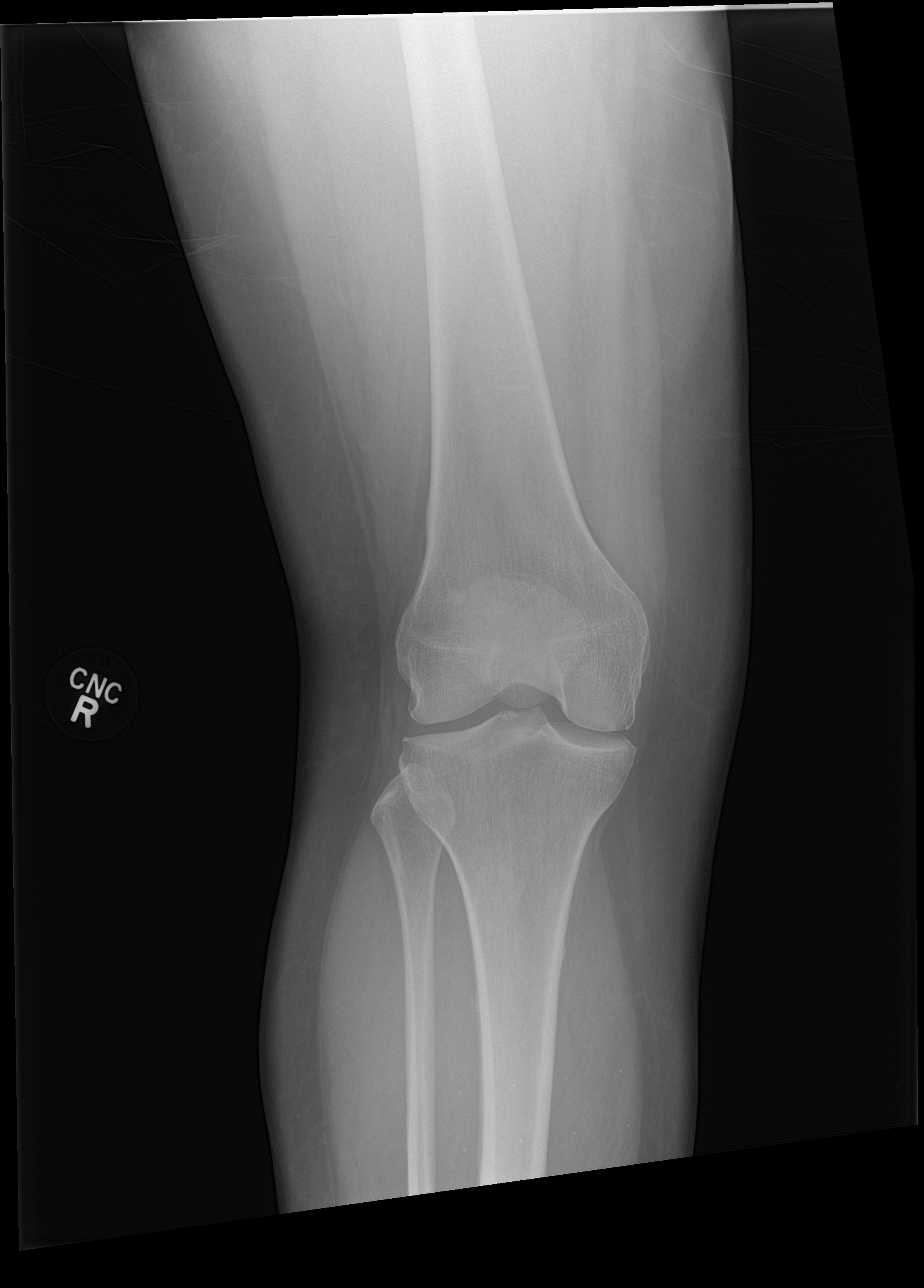

[knee tunnel]
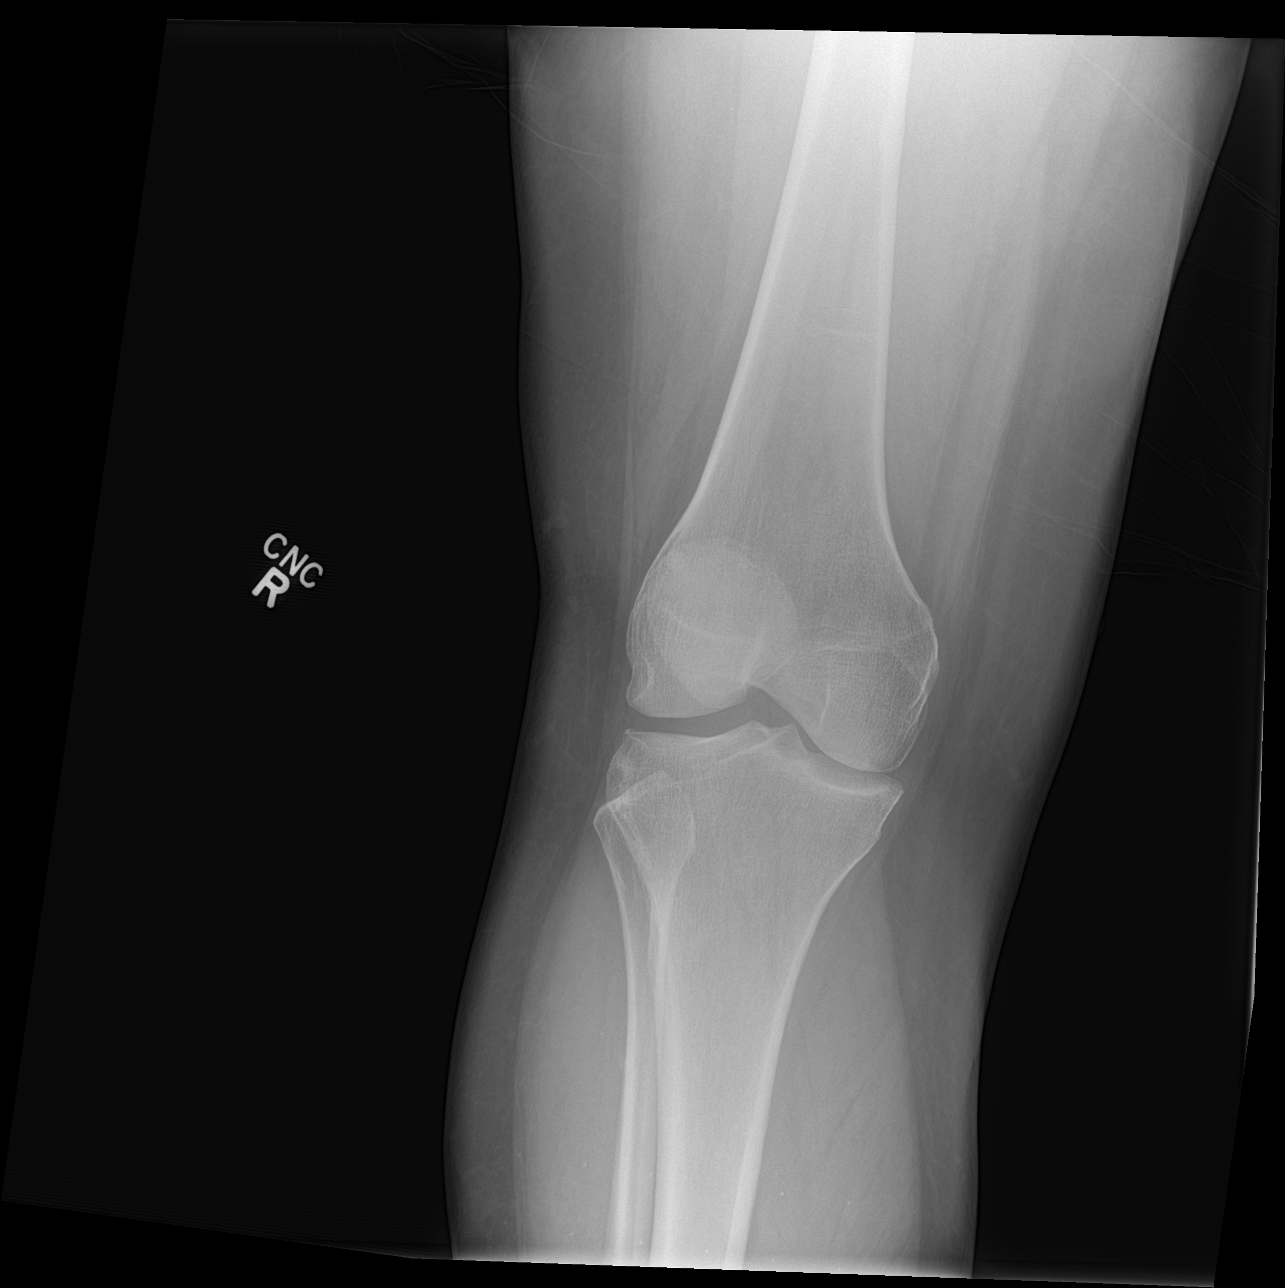

[knee lat]
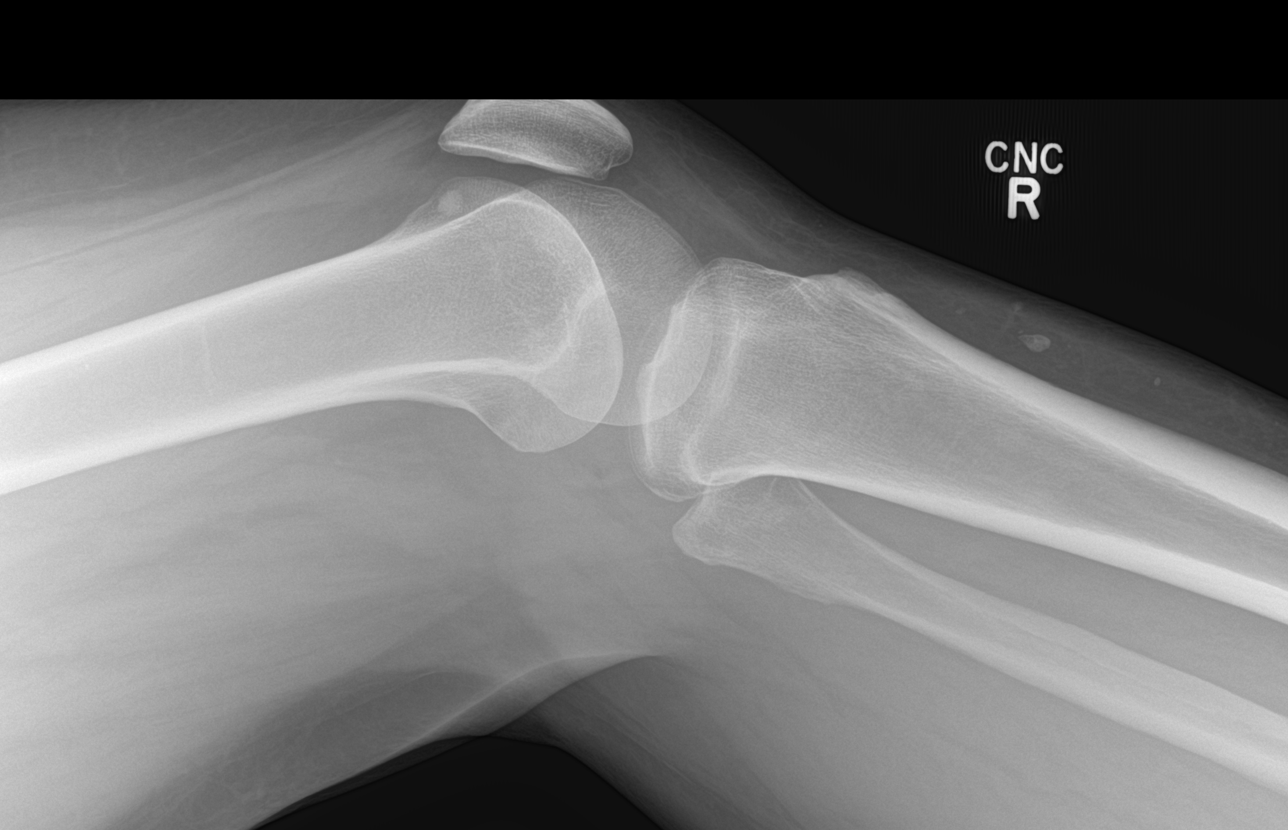

[knee obl]
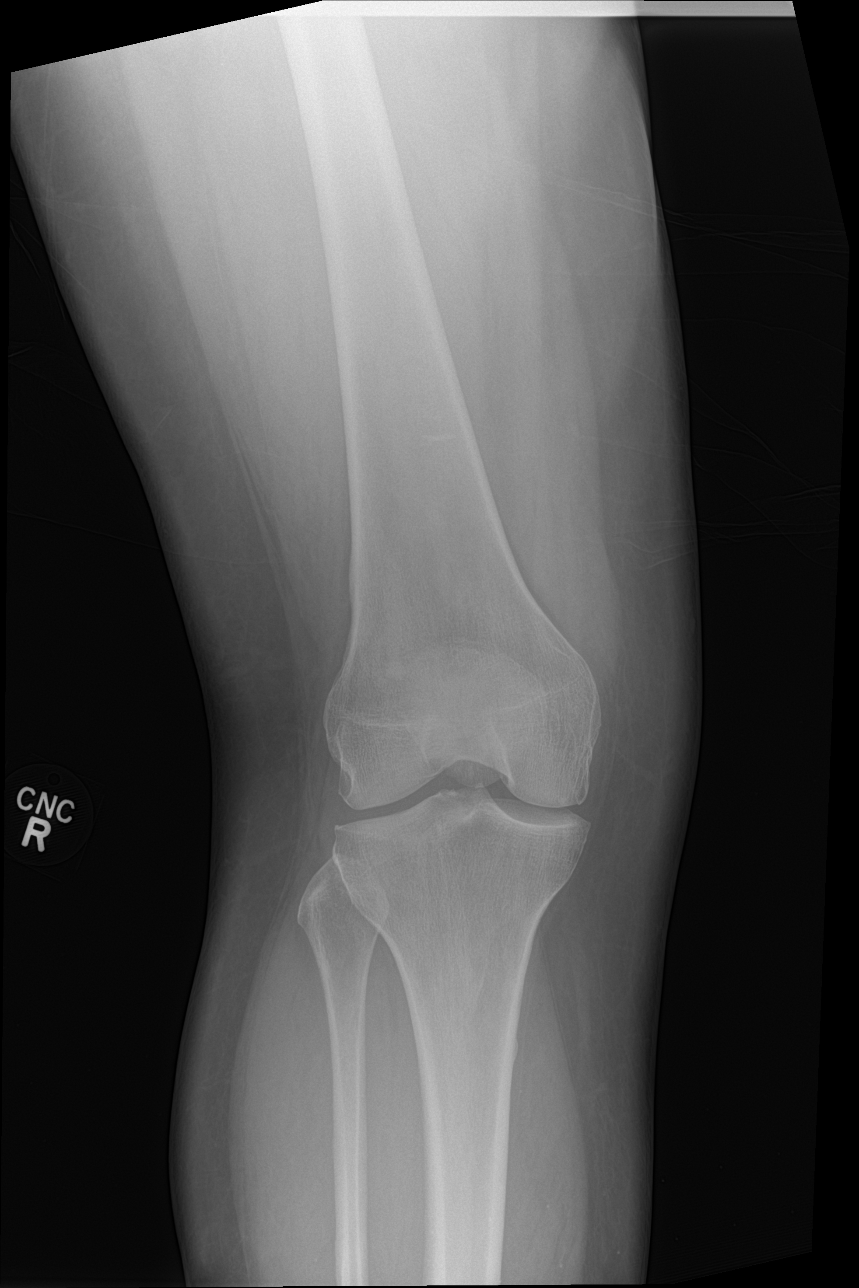

[4 of 4 positions shown; findings below may reference images not displayed]

FINDINGS: No evidence of fracture, dislocation, or joint effusion. No evidence
of arthropathy or other focal bone abnormality. Soft tissues are
unremarkable.
IMPRESSION: Negative.

## 2018-03-26 ENCOUNTER — Ambulatory Visit: Payer: Self-pay

## 2018-06-26 ENCOUNTER — Encounter
Admission: EM | Disposition: A | Discharge status: Home / Self Care | Payer: Self-pay | Source: Home / Self Care | Attending: Emergency Medicine

## 2018-06-26 ENCOUNTER — Emergency Department: Payer: Self-pay

## 2018-06-26 ENCOUNTER — Encounter: Payer: Self-pay | Admitting: Emergency Medicine

## 2018-06-26 ENCOUNTER — Observation Stay
Admission: EM | Admit: 2018-06-26 | Discharge: 2018-06-27 | Disposition: A | Discharge status: Home / Self Care | Payer: Self-pay | Attending: Surgery | Admitting: Surgery

## 2018-06-26 ENCOUNTER — Emergency Department: Payer: Self-pay | Admitting: Registered Nurse

## 2018-06-26 ENCOUNTER — Other Ambulatory Visit: Payer: Self-pay

## 2018-06-26 DIAGNOSIS — F17210 Nicotine dependence, cigarettes, uncomplicated: Secondary | ICD-10-CM | POA: Insufficient documentation

## 2018-06-26 DIAGNOSIS — K219 Gastro-esophageal reflux disease without esophagitis: Secondary | ICD-10-CM | POA: Insufficient documentation

## 2018-06-26 DIAGNOSIS — E782 Mixed hyperlipidemia: Secondary | ICD-10-CM | POA: Insufficient documentation

## 2018-06-26 DIAGNOSIS — I10 Essential (primary) hypertension: Secondary | ICD-10-CM | POA: Insufficient documentation

## 2018-06-26 DIAGNOSIS — Z6831 Body mass index (BMI) 31.0-31.9, adult: Secondary | ICD-10-CM | POA: Insufficient documentation

## 2018-06-26 DIAGNOSIS — E669 Obesity, unspecified: Secondary | ICD-10-CM | POA: Insufficient documentation

## 2018-06-26 DIAGNOSIS — F329 Major depressive disorder, single episode, unspecified: Secondary | ICD-10-CM | POA: Insufficient documentation

## 2018-06-26 DIAGNOSIS — K8010 Calculus of gallbladder with chronic cholecystitis without obstruction: Secondary | ICD-10-CM | POA: Insufficient documentation

## 2018-06-26 DIAGNOSIS — Z9109 Other allergy status, other than to drugs and biological substances: Secondary | ICD-10-CM | POA: Insufficient documentation

## 2018-06-26 DIAGNOSIS — Z880 Allergy status to penicillin: Secondary | ICD-10-CM | POA: Insufficient documentation

## 2018-06-26 DIAGNOSIS — F419 Anxiety disorder, unspecified: Secondary | ICD-10-CM | POA: Insufficient documentation

## 2018-06-26 DIAGNOSIS — E559 Vitamin D deficiency, unspecified: Secondary | ICD-10-CM | POA: Insufficient documentation

## 2018-06-26 DIAGNOSIS — Z23 Encounter for immunization: Secondary | ICD-10-CM | POA: Insufficient documentation

## 2018-06-26 DIAGNOSIS — K8000 Calculus of gallbladder with acute cholecystitis without obstruction: Principal | ICD-10-CM | POA: Diagnosis present

## 2018-06-26 DIAGNOSIS — N210 Calculus in bladder: Secondary | ICD-10-CM

## 2018-06-26 DIAGNOSIS — K810 Acute cholecystitis: Secondary | ICD-10-CM

## 2018-06-26 DIAGNOSIS — Z79899 Other long term (current) drug therapy: Secondary | ICD-10-CM | POA: Insufficient documentation

## 2018-06-26 HISTORY — PX: CHOLECYSTECTOMY: SHX55

## 2018-06-26 LAB — COMPREHENSIVE METABOLIC PANEL
ALBUMIN: 4.5 g/dL (ref 3.5–5.0)
ALT: 35 U/L (ref 0–44)
ANION GAP: 12 (ref 5–15)
AST: 70 U/L — ABNORMAL HIGH (ref 15–41)
Alkaline Phosphatase: 39 U/L (ref 38–126)
BUN: 13 mg/dL (ref 6–20)
CHLORIDE: 99 mmol/L (ref 98–111)
CO2: 30 mmol/L (ref 22–32)
Calcium: 9.5 mg/dL (ref 8.9–10.3)
Creatinine, Ser: 0.53 mg/dL (ref 0.44–1.00)
GFR calc Af Amer: >60 mL/min (ref >60)
GFR calc non Af Amer: >60 mL/min (ref >60)
GLUCOSE: 108 mg/dL — AB (ref 70–99)
POTASSIUM: 3.2 mmol/L — AB (ref 3.5–5.1)
Sodium: 141 mmol/L (ref 135–145)
Total Bilirubin: 1.3 mg/dL — ABNORMAL HIGH (ref 0.3–1.2)
Total Protein: 7.6 g/dL (ref 6.5–8.1)

## 2018-06-26 LAB — CBC
HCT: 44 % (ref 35.0–47.0)
Hemoglobin: 15 g/dL (ref 12.0–16.0)
MCH: 29.2 pg (ref 26.0–34.0)
MCHC: 34.1 g/dL (ref 32.0–36.0)
MCV: 85.6 fL (ref 80.0–100.0)
Platelets: 300 10*3/uL (ref 150–440)
RBC: 5.13 MIL/uL (ref 3.80–5.20)
RDW: 14.5 % (ref 11.5–14.5)
WBC: 9.2 10*3/uL (ref 3.6–11.0)

## 2018-06-26 LAB — URINALYSIS, COMPLETE (UACMP) WITH MICROSCOPIC
BILIRUBIN URINE: NEGATIVE
Glucose, UA: NEGATIVE mg/dL
Hgb urine dipstick: NEGATIVE
KETONES UR: NEGATIVE mg/dL
NITRITE: NEGATIVE
PROTEIN: 100 mg/dL — AB
Specific Gravity, Urine: 1.017 (ref 1.005–1.030)
pH: 8 (ref 5.0–8.0)

## 2018-06-26 LAB — POCT PREGNANCY, URINE: Preg Test, Ur: NEGATIVE

## 2018-06-26 LAB — LIPASE, BLOOD: LIPASE: 38 U/L (ref 11–51)

## 2018-06-26 SURGERY — LAPAROSCOPIC CHOLECYSTECTOMY WITH INTRAOPERATIVE CHOLANGIOGRAM
Anesthesia: General

## 2018-06-26 MED ORDER — ACETAMINOPHEN 500 MG PO TABS
1000.0000 mg | ORAL_TABLET | ORAL | Status: AC
  Administered 2018-06-26: 1000 mg via ORAL

## 2018-06-26 MED ORDER — SUGAMMADEX SODIUM 200 MG/2ML IV SOLN
INTRAVENOUS | Status: DC | PRN
  Administered 2018-06-26: 200 mg via INTRAVENOUS

## 2018-06-26 MED ORDER — ROCURONIUM BROMIDE 100 MG/10ML IV SOLN
INTRAVENOUS | Status: DC | PRN
  Administered 2018-06-26: 5 mg via INTRAVENOUS
  Administered 2018-06-26: 10 mg via INTRAVENOUS
  Administered 2018-06-26: 25 mg via INTRAVENOUS
  Administered 2018-06-26: 5 mg via INTRAVENOUS
  Administered 2018-06-26: 10 mg via INTRAVENOUS

## 2018-06-26 MED ORDER — ONDANSETRON HCL 4 MG/2ML IJ SOLN
INTRAMUSCULAR | Status: DC | PRN
  Administered 2018-06-26: 4 mg via INTRAVENOUS

## 2018-06-26 MED ORDER — PROPOFOL 10 MG/ML IV BOLUS
INTRAVENOUS | Status: AC
  Filled 2018-06-26: qty 20

## 2018-06-26 MED ORDER — ONDANSETRON 4 MG PO TBDP: 4.0000 mg | ORAL_TABLET | Freq: Four times a day (QID) | ORAL | Status: DC | PRN

## 2018-06-26 MED ORDER — DEXAMETHASONE SODIUM PHOSPHATE 10 MG/ML IJ SOLN
INTRAMUSCULAR | Status: DC | PRN
  Administered 2018-06-26: 4 mg via INTRAVENOUS

## 2018-06-26 MED ORDER — CHLORHEXIDINE GLUCONATE CLOTH 2 % EX PADS
6.0000 | MEDICATED_PAD | Freq: Once | CUTANEOUS | Status: DC
  Filled 2018-06-26: qty 6

## 2018-06-26 MED ORDER — GLYCOPYRROLATE 0.2 MG/ML IJ SOLN
INTRAMUSCULAR | Status: DC | PRN
  Administered 2018-06-26: 0.2 mg via INTRAVENOUS

## 2018-06-26 MED ORDER — HYDROMORPHONE HCL 1 MG/ML IJ SOLN
INTRAMUSCULAR | Status: AC
  Filled 2018-06-26: qty 1

## 2018-06-26 MED ORDER — KCL IN DEXTROSE-NACL 20-5-0.45 MEQ/L-%-% IV SOLN
INTRAVENOUS | Status: DC
  Administered 2018-06-26: 17:00:00 via INTRAVENOUS
  Filled 2018-06-26 (×4): qty 1000

## 2018-06-26 MED ORDER — MIDAZOLAM HCL 2 MG/2ML IJ SOLN
INTRAMUSCULAR | Status: AC
  Filled 2018-06-26: qty 2

## 2018-06-26 MED ORDER — ONDANSETRON HCL 4 MG/2ML IJ SOLN
INTRAMUSCULAR | Status: AC
  Filled 2018-06-26: qty 2

## 2018-06-26 MED ORDER — OXYCODONE-ACETAMINOPHEN 5-325 MG PO TABS
1.0000 | ORAL_TABLET | ORAL | Status: DC | PRN
  Administered 2018-06-26 – 2018-06-27 (×3): 1 via ORAL
  Filled 2018-06-26 (×3): qty 1
  Filled 2018-06-26: qty 2
  Filled 2018-06-26: qty 1

## 2018-06-26 MED ORDER — LIDOCAINE HCL (CARDIAC) PF 100 MG/5ML IV SOSY
PREFILLED_SYRINGE | INTRAVENOUS | Status: DC | PRN
  Administered 2018-06-26: 80 mg via INTRAVENOUS

## 2018-06-26 MED ORDER — CIPROFLOXACIN IN D5W 400 MG/200ML IV SOLN
400.0000 mg | INTRAVENOUS | Status: AC
  Administered 2018-06-26: 400 mg via INTRAVENOUS

## 2018-06-26 MED ORDER — PNEUMOCOCCAL VAC POLYVALENT 25 MCG/0.5ML IJ INJ
0.5000 mL | INJECTION | INTRAMUSCULAR | Status: AC
  Administered 2018-06-27: 0.5 mL via INTRAMUSCULAR
  Filled 2018-06-26: qty 0.5

## 2018-06-26 MED ORDER — MORPHINE SULFATE (PF) 2 MG/ML IV SOLN
2.0000 mg | INTRAVENOUS | Status: DC | PRN
  Administered 2018-06-27: 2 mg via INTRAVENOUS
  Filled 2018-06-26: qty 1

## 2018-06-26 MED ORDER — PROPOFOL 10 MG/ML IV BOLUS
INTRAVENOUS | Status: DC | PRN
  Administered 2018-06-26: 180 mg via INTRAVENOUS

## 2018-06-26 MED ORDER — CIPROFLOXACIN IN D5W 400 MG/200ML IV SOLN
INTRAVENOUS | Status: AC
  Filled 2018-06-26: qty 200

## 2018-06-26 MED ORDER — KETOROLAC TROMETHAMINE 15 MG/ML IJ SOLN
15.0000 mg | Freq: Four times a day (QID) | INTRAMUSCULAR | Status: DC
  Administered 2018-06-26 – 2018-06-27 (×4): 15 mg via INTRAVENOUS
  Filled 2018-06-26 (×5): qty 1

## 2018-06-26 MED ORDER — SUCCINYLCHOLINE CHLORIDE 20 MG/ML IJ SOLN
INTRAMUSCULAR | Status: DC | PRN
  Administered 2018-06-26: 140 mg via INTRAVENOUS

## 2018-06-26 MED ORDER — FENTANYL CITRATE (PF) 100 MCG/2ML IJ SOLN: 50.0000 ug | Freq: Once | INTRAMUSCULAR | Status: DC

## 2018-06-26 MED ORDER — LIDOCAINE HCL (PF) 2 % IJ SOLN
INTRAMUSCULAR | Status: AC
  Filled 2018-06-26: qty 10

## 2018-06-26 MED ORDER — LIDOCAINE HCL 1 % IJ SOLN
INTRAMUSCULAR | Status: DC | PRN
  Administered 2018-06-26: 20 mL via INTRAMUSCULAR

## 2018-06-26 MED ORDER — FENTANYL CITRATE (PF) 100 MCG/2ML IJ SOLN
INTRAMUSCULAR | Status: AC
  Filled 2018-06-26: qty 2

## 2018-06-26 MED ORDER — FENTANYL CITRATE (PF) 100 MCG/2ML IJ SOLN
INTRAMUSCULAR | Status: DC | PRN
  Administered 2018-06-26: 100 ug via INTRAVENOUS

## 2018-06-26 MED ORDER — ONDANSETRON HCL 4 MG/2ML IJ SOLN
4.0000 mg | Freq: Four times a day (QID) | INTRAMUSCULAR | Status: DC | PRN
  Administered 2018-06-27: 4 mg via INTRAVENOUS
  Filled 2018-06-26: qty 2

## 2018-06-26 MED ORDER — ROCURONIUM BROMIDE 50 MG/5ML IV SOLN
INTRAVENOUS | Status: AC
  Filled 2018-06-26: qty 1

## 2018-06-26 MED ORDER — ONDANSETRON HCL 4 MG/2ML IJ SOLN: 4.0000 mg | Freq: Once | INTRAMUSCULAR | Status: DC | PRN

## 2018-06-26 MED ORDER — CIPROFLOXACIN IN D5W 400 MG/200ML IV SOLN
400.0000 mg | Freq: Two times a day (BID) | INTRAVENOUS | Status: AC
  Administered 2018-06-27: 400 mg via INTRAVENOUS
  Filled 2018-06-26: qty 200

## 2018-06-26 MED ORDER — ACETAMINOPHEN 500 MG PO TABS
1000.0000 mg | ORAL_TABLET | Freq: Four times a day (QID) | ORAL | Status: DC
  Administered 2018-06-26 – 2018-06-27 (×3): 1000 mg via ORAL
  Filled 2018-06-26 (×3): qty 2

## 2018-06-26 MED ORDER — MIDAZOLAM HCL 5 MG/5ML IJ SOLN
INTRAMUSCULAR | Status: DC | PRN
  Administered 2018-06-26: 2 mg via INTRAVENOUS

## 2018-06-26 MED ORDER — SUCCINYLCHOLINE CHLORIDE 20 MG/ML IJ SOLN
INTRAMUSCULAR | Status: AC
  Filled 2018-06-26: qty 1

## 2018-06-26 MED ORDER — LACTATED RINGERS IV SOLN
INTRAVENOUS | Status: DC | PRN
  Administered 2018-06-26: 13:00:00 via INTRAVENOUS

## 2018-06-26 MED ORDER — DEXAMETHASONE SODIUM PHOSPHATE 10 MG/ML IJ SOLN
INTRAMUSCULAR | Status: AC
  Filled 2018-06-26: qty 1

## 2018-06-26 MED ORDER — PHENYLEPHRINE HCL 10 MG/ML IJ SOLN
INTRAMUSCULAR | Status: DC | PRN
  Administered 2018-06-26: 50 ug via INTRAVENOUS
  Administered 2018-06-26 (×8): 100 ug via INTRAVENOUS

## 2018-06-26 MED ORDER — FENTANYL CITRATE (PF) 100 MCG/2ML IJ SOLN
25.0000 ug | INTRAMUSCULAR | Status: DC | PRN
  Administered 2018-06-26 (×2): 25 ug via INTRAVENOUS

## 2018-06-26 MED ORDER — LIDOCAINE HCL (PF) 1 % IJ SOLN
INTRAMUSCULAR | Status: AC
  Filled 2018-06-26: qty 30

## 2018-06-26 MED ORDER — ENOXAPARIN SODIUM 40 MG/0.4ML ~~LOC~~ SOLN
40.0000 mg | SUBCUTANEOUS | Status: DC
  Administered 2018-06-27: 40 mg via SUBCUTANEOUS
  Filled 2018-06-26: qty 0.4

## 2018-06-26 MED ORDER — BUPIVACAINE HCL (PF) 0.5 % IJ SOLN
INTRAMUSCULAR | Status: AC
  Filled 2018-06-26: qty 30

## 2018-06-26 MED ORDER — FENTANYL CITRATE (PF) 100 MCG/2ML IJ SOLN
INTRAMUSCULAR | Status: AC
  Administered 2018-06-26: 25 ug via INTRAVENOUS
  Filled 2018-06-26: qty 2

## 2018-06-26 MED ORDER — HYDROMORPHONE HCL 1 MG/ML IJ SOLN
INTRAMUSCULAR | Status: DC | PRN
  Administered 2018-06-26 (×2): .5 mg via INTRAVENOUS

## 2018-06-26 MED ORDER — SUGAMMADEX SODIUM 200 MG/2ML IV SOLN
INTRAVENOUS | Status: AC
  Filled 2018-06-26: qty 2

## 2018-06-26 MED ORDER — GABAPENTIN 300 MG PO CAPS
300.0000 mg | ORAL_CAPSULE | ORAL | Status: AC
  Administered 2018-06-26: 300 mg via ORAL

## 2018-06-26 SURGICAL SUPPLY — 37 items
APPLIER CLIP ROT 10 11.4 M/L (STAPLE) ×3
CATH REDDICK CHOLANGI 4FR 50CM (CATHETERS) ×3 IMPLANT
CHLORAPREP W/TINT 26ML (MISCELLANEOUS) ×3 IMPLANT
CLIP APPLIE ROT 10 11.4 M/L (STAPLE) ×1 IMPLANT
DECANTER SPIKE VIAL GLASS SM (MISCELLANEOUS) ×3 IMPLANT
DERMABOND ADVANCED (GAUZE/BANDAGES/DRESSINGS) ×2
DERMABOND ADVANCED .7 DNX12 (GAUZE/BANDAGES/DRESSINGS) ×1 IMPLANT
DRAPE SHEET LG 3/4 BI-LAMINATE (DRAPES) ×3 IMPLANT
DRESSING SURGICEL FIBRLLR 1X2 (HEMOSTASIS) IMPLANT
DRSG SURGICEL FIBRILLAR 1X2 (HEMOSTASIS)
ELECT REM PT RETURN 9FT ADLT (ELECTROSURGICAL) ×3
ELECTRODE REM PT RTRN 9FT ADLT (ELECTROSURGICAL) ×1 IMPLANT
GLOVE BIO SURGEON STRL SZ7 (GLOVE) ×3 IMPLANT
GLOVE BIOGEL PI IND STRL 7.5 (GLOVE) ×1 IMPLANT
GLOVE BIOGEL PI INDICATOR 7.5 (GLOVE) ×2
GOWN STRL REUS W/ TWL LRG LVL3 (GOWN DISPOSABLE) ×3 IMPLANT
GOWN STRL REUS W/TWL LRG LVL3 (GOWN DISPOSABLE) ×6
GRASPER SUT TROCAR 14GX15 (MISCELLANEOUS) ×3 IMPLANT
IRRIGATION STRYKERFLOW (MISCELLANEOUS) IMPLANT
IRRIGATOR STRYKERFLOW (MISCELLANEOUS)
IV NS 1000ML (IV SOLUTION)
IV NS 1000ML BAXH (IV SOLUTION) IMPLANT
KIT TURNOVER KIT A (KITS) ×3 IMPLANT
NEEDLE HYPO 22GX1.5 SAFETY (NEEDLE) ×3 IMPLANT
NEEDLE INSUFFLATION 14GA 120MM (NEEDLE) ×3 IMPLANT
NS IRRIG 1000ML POUR BTL (IV SOLUTION) ×3 IMPLANT
PACK LAP CHOLECYSTECTOMY (MISCELLANEOUS) ×3 IMPLANT
POUCH SPECIMEN RETRIEVAL 10MM (ENDOMECHANICALS) ×3 IMPLANT
SCISSORS METZENBAUM CVD 33 (INSTRUMENTS) ×3 IMPLANT
SLEEVE ENDOPATH XCEL 5M (ENDOMECHANICALS) ×6 IMPLANT
SUT MNCRL AB 4-0 PS2 18 (SUTURE) ×3 IMPLANT
SUT VICRYL 0 UR6 27IN ABS (SUTURE) ×3 IMPLANT
SUT VICRYL AB 3-0 FS1 BRD 27IN (SUTURE) ×3 IMPLANT
SYR 20CC LL (SYRINGE) ×3 IMPLANT
TROCAR XCEL NON-BLD 11X100MML (ENDOMECHANICALS) ×3 IMPLANT
TROCAR XCEL NON-BLD 5MMX100MML (ENDOMECHANICALS) ×3 IMPLANT
TUBING INSUFFLATION (TUBING) ×3 IMPLANT

## 2018-06-26 NOTE — Anesthesia Postprocedure Evaluation (Signed)
Anesthesia Post Note  Patient: Monica Moody  Procedure(s) Performed: LAPAROSCOPIC CHOLECYSTECTOMY WITH INTRAOPERATIVE CHOLANGIOGRAM (N/A )  Patient location during evaluation: PACU Anesthesia Type: General Level of consciousness: awake and alert Pain management: pain level controlled Vital Signs Assessment: post-procedure vital signs reviewed and stable Respiratory status: spontaneous breathing, nonlabored ventilation, respiratory function stable and patient connected to nasal cannula oxygen Cardiovascular status: blood pressure returned to baseline and stable Postop Assessment: no apparent nausea or vomiting Anesthetic complications: no     Last Vitals:  Vitals:   06/26/18 1720 06/26/18 1818  BP: 139/83 129/79  Pulse: (!) 55 (!) 54  Resp: 15 16  Temp: (!) 36.3 C 36.6 C  SpO2: 100% 100%    Last Pain:  Vitals:   06/26/18 1818  TempSrc: Oral  PainSc:                  [name] S

## 2018-06-26 NOTE — Transfer of Care (Signed)
Immediate Anesthesia Transfer of Care Note  Patient: Monica Moody  Procedure(s) Performed: LAPAROSCOPIC CHOLECYSTECTOMY WITH INTRAOPERATIVE CHOLANGIOGRAM (N/A )  Patient Location: PACU  Anesthesia Type:General  Level of Consciousness: awake  Airway & Oxygen Therapy: Patient Spontanous Breathing  Post-op Assessment: Report given to RN  Post vital signs: stable  Last Vitals:  Vitals Value Taken Time  BP 139/65 06/26/2018  3:34 PM  Temp    Pulse 76 06/26/2018  3:37 PM  Resp 18 06/26/2018  3:37 PM  SpO2 100 % 06/26/2018  3:37 PM  Vitals shown include unvalidated device data.  Last Pain:  Vitals:   06/26/18 1236  TempSrc: Temporal  PainSc:          Complications: No apparent anesthesia complications

## 2018-06-26 NOTE — Anesthesia Post-op Follow-up Note (Signed)
Anesthesia QCDR form completed.        

## 2018-06-26 NOTE — Consult Note (Addendum)
SURGICAL CONSULTATION NOTE (initial) - cpt: [id number]: [id number]  Patient seen and examined as described below with surgical PA-C, [name].  Assessment/Plan: (ICD-10's: K80.01) In summary, patient is a 52 y.o. female with acute calculous cholecystitis and mild hyperbilirubinemia, likely reactive considering non-dilated CBD, though cannot exclude partially obstructing choledocholithiasis, complicated by pertinent comorbidities including obesity (BMI 32) s/p significant weight loss, HTN, HLD, former borderline DM prior to weight loss, GERD, generalized anxiety disorder, major depression disorder, and chronic ongoing tobacco abuse (smoking).   - NPO, IV fluids   - IV antibiotics (ciprofloxacin due to reported penicillin allergy)  - all risks, benefits, and alternatives to laparoscopic cholecystectomy with intra-operative cholangiogram were discussed with the patient and her family, all of their questions were answered to their expressed satisfaction, patient expresses she wishes to proceed, and informed consent was obtained.  - will plan for laparoscopic cholecystectomy with intra-operative cholangiogram today pending OR availability  - DVT prophylaxis  I have personally reviewed the patient's chart, evaluated/examined the patient, proposed the recommended management, and discussed these recommendations with the patient and her family to their expressed satisfaction as well as with patient's RN and ED physician.  Thank you for the opportunity to participate in this patient's care.  -- Scherrie GerlachJason E. Earlene Plateravis, MD, RPVI [place]: [facility] General Surgery - Partnering for exceptional care. Office: [phone]    SURGICAL CONSULTATION NOTE (initial) - cpt: [phone] (Outpatient/ED)  HISTORY OF PRESENT ILLNESS (HPI):  52 y.o. female [facility] for evaluation of abdominal pain. Patient reports that she awoke from sleep around 3 AM with severe pain in her RUQ. She described the  pain as a sharp waxing and waning squeezing pain. The pain lasted until arrival in the ED around 7 AM when it spontaneously subsided. Denied taking anything for the pain. She does believe she has had much less severe similar pain over the last 6 months. She does endorse doing the ketogenic diet over the last year. She denied any fevers, chills, CP, SOB, nausea, emesis, bowel changes, or urinary changes.   Surgery [facility] ED physician Dr. Lenard LancePaduchowski, MD in this context for evaluation and management of cholecysitis.  PAST MEDICAL HISTORY (PMH):  Past Medical History:  Diagnosis Date  . Abscess of chest wall   . Anxiety   . Depression   . GERD (gastroesophageal reflux disease)   . Hyperlipidemia   . Hypertension   . IFG (impaired fasting glucose)   . Obesity   . Palpitations   . Tobacco use      PAST SURGICAL HISTORY (PSH):  Past Surgical History:  Procedure Laterality Date  . BREAST BIOPSY Left    surgical bx  . BREAST LUMPECTOMY Left   . mesh sling implated     due to urethra dropping  . OVARIAN CYST REMOVAL Right   . TUBAL LIGATION    . Uterine Ablation       MEDICATIONS:  Prior to Admission medications   Medication Sig Start Date End Date Taking? Authorizing Provider  cholecalciferol (VITAMIN D) 1000 UNITS tablet Take 1,000 Units by mouth daily. Every other day.    [provider]  DULoxetine (CYMBALTA) 60 MG capsule TAKE 1 CAPSULE BY MOUTH ONCE DAILY. 05/17/16   Particia NearingLane, Rachel Elizabeth, PA-C  fenofibrate (TRICOR) 145 MG tablet Take 1 tablet (145 mg total) by mouth daily. 03/05/18   Kallie LocksStroud, Natalie M, FNP  fluvoxaMINE (LUVOX) 50 MG tablet Take 50 mg by mouth at bedtime.  [provider]  hydrochlorothiazide (HYDRODIURIL) 25 MG tablet Take 1 tablet (25 mg total) by mouth daily. 03/05/18   Kallie Locks, FNP  ibuprofen (ADVIL,MOTRIN) 800 MG tablet Take 1 tablet (800 mg total) by mouth every 8 (eight) hours as needed. 03/05/18   Kallie Locks,  FNP  lamoTRIgine (LAMICTAL) 25 MG tablet Take by mouth daily.    [provider]  lansoprazole (PREVACID) 15 MG capsule Take 1 capsule (15 mg total) by mouth daily at 12 noon. 03/05/18   Kallie Locks, FNP  metoprolol tartrate (LOPRESSOR) 50 MG tablet Take 1 tablet (50 mg total) by mouth 2 (two) times daily. 03/05/18   Kallie Locks, FNP  vitamin E 100 UNIT capsule Take by mouth daily.    [provider]     ALLERGIES:  Allergies  Allergen Reactions  . Bupropion Other (See Comments)    Hands peel  . Celexa [Citalopram Hydrobromide] Other (See Comments)    Affects heart  . Penicillins Swelling    Has patient had a PCN reaction causing immediate rash, facial/tongue/throat swelling, SOB or lightheadedness with hypotension: Yes Has patient had a PCN reaction causing severe rash involving mucus membranes or skin necrosis: No Has patient had a PCN reaction that required hospitalization: No Has patient had a PCN reaction occurring within the last 10 years: Unknown If all of the above answers are "NO", then may proceed with Cephalosporin use.      SOCIAL HISTORY:  Social History   Socioeconomic History  . Marital status: Married    Spouse name: Not on file  . Number of children: Not on file  . Years of education: Not on file  . Highest education level: Not on file  Occupational History  . Not on file  Social Needs  . Financial resource strain: Not on file  . Food insecurity:    Worry: Not on file    Inability: Not on file  . Transportation needs:    Medical: Not on file    Non-medical: Not on file  Tobacco Use  . Smoking status: Current Every Day Smoker    Packs/day: 0.50    Years: 30.00    Pack years: 15.00    Types: Cigarettes  . Smokeless tobacco: Never Used  Substance and Sexual Activity  . Alcohol use: Yes    Comment: once every few months  . Drug use: No  . Sexual activity: Yes    Comment: Says she's had her tubes tied  Lifestyle  . Physical  activity:    Days per week: Not on file    Minutes per session: Not on file  . Stress: Not on file  Relationships  . Social connections:    Talks on phone: Not on file    Gets together: Not on file    Attends religious service: Not on file    Active member of club or organization: Not on file    Attends meetings of clubs or organizations: Not on file    Relationship status: Not on file  . Intimate partner violence:    Fear of current or ex partner: Not on file    Emotionally abused: Not on file    Physically abused: Not on file    Forced sexual activity: Not on file  Other Topics Concern  . Not on file  Social History Narrative  . Not on file    The patient currently resides: Home The patient normally is (ambulatory / bedbound): Ambulatory  FAMILY HISTORY:  Family History  Problem Relation Age of Onset  . Arthritis Mother   . Diabetes Mother   . Heart disease Mother   . Hyperlipidemia Mother   . Hypertension Mother   . Migraines Mother   . Cancer Mother        breast  . Diabetes Father   . Hyperlipidemia Father   . Hypertension Father   . Cancer Father        kidney  . Migraines Brother   . Cancer Maternal Aunt        ovarian  . Cancer Paternal Uncle        intestines  . Cancer Maternal Grandmother        stomach  . COPD Maternal Grandfather   . Stroke Neg Hx      REVIEW OF SYSTEMS:  Constitutional: denies weight loss, fever, chills, or sweats  Eyes: denies any other vision changes, history of eye injury  ENT: denies sore throat, hearing problems  Respiratory: denies shortness of breath, wheezing  Cardiovascular: denies chest pain, palpitations  Gastrointestinal: + abdominal pain, denies N/V, or diarrhea/and bowel function as per HPI Genitourinary: denies burning with urination or urinary frequency Musculoskeletal: denies any other joint pains or cramps  Skin: denies any other rashes or skin discolorations  Neurological: denies any other headache,  dizziness, weakness  Psychiatric: denies any other depression, anxiety   All other review of systems were negative   VITAL SIGNS:  Temp:  [97.7 F (36.5 C)] 97.7 F (36.5 C) (08/28 0621) Pulse Rate:  [68-80] 68 (08/28 1006) Resp:  [16-18] 18 (08/28 1006) BP: (103-127)/(70-82) 127/70 (08/28 1006) SpO2:  [98 %-99 %] 98 % (08/28 1006) Weight:  [81.2 kg] 81.2 kg (08/28 0616)     Height: 5\' 3"  (160 cm) Weight: 81.2 kg BMI (Calculated): 31.72   INTAKE/OUTPUT:  This shift: No intake/output data recorded.  Last 2 shifts: @IOLAST2SHIFTS @   PHYSICAL EXAM:  Constitutional: -- Obese body habitus  -- Awake, alert, and oriented x3, no apparent distress Eyes:  -- Pupils equally round and reactive to light  -- No scleral icterus, B/L no occular discharge Ear, nose, throat: -- Neck is FROM WNL Pulmonary:  -- No wheezes or rhales -- Equal breath sounds bilaterally -- Breathing non-labored at rest Cardiovascular:  -- HRRR -- S1, S2 present  -- No pericardial rubs  Gastrointestinal:  -- Obese, Abdomen soft, RUQ Tenderness, non-distended, no guarding or rebound tenderness -- Positive Murphy's Sign -- Extremities: B/L UE and LE FROM, hands and feet warm, no edema  Neurologic:  -- Motor function: Intact and symmetric -- Sensation: Intact and symmetric Psychiatric:  -- Mood and affect WNL   Labs:  CBC Latest Ref Rng & Units 06/26/2018 03/05/2018 06/21/2017  WBC 3.6 - 11.0 K/uL 9.2 8.0 7.7  Hemoglobin 12.0 - 16.0 g/dL 16.1 09.6 04.5  Hematocrit 35.0 - 47.0 % 44.0 41.4 40.1  Platelets 150 - 440 K/uL 300 306 248   CMP Latest Ref Rng & Units 06/26/2018 11/01/2017 06/21/2017  Glucose 70 - 99 mg/dL 409(W) 80 74  BUN 6 - 20 mg/dL 13 18 13   Creatinine 0.44 - 1.00 mg/dL 1.19 1.47(W) 2.95  Sodium 135 - 145 mmol/L 141 141 137  Potassium 3.5 - 5.1 mmol/L 3.2(L) 3.7 3.5  Chloride 98 - 111 mmol/L 99 98 94(L)  CO2 22 - 32 mmol/L 30 25 25   Calcium 8.9 - 10.3 mg/dL 9.5 9.7 9.4  Total Protein 6.5 - 8.1  g/dL  7.6 6.7 6.7  Total Bilirubin 0.3 - 1.2 mg/dL 1.6(X) 0.2 0.3  Alkaline Phos 38 - 126 U/L 39 41 58  AST 15 - 41 U/L 70(H) 14 18  ALT 0 - 44 U/L 35 13 13    Imaging studies:  [place] RUQ on 06/26/18:   IMPRESSION: Multiple gallstones with gallbladder wall thickening suspicious for acute cholecystitis. Negative sonographic Murphy's sign is noted However.  Assessment/Plan: (ICD-10's: K81.0, E80.7) 52 y.o. female with acute cholecystitis with elevated total bilirubin levels, complicated by pertinent comorbidities including HTN, HLD, and smoking history.   - Given patient's history, gallbladder wall thickening on [place], and elevated T. Bilirubin will plan for Laparoscopic Cholecystectomy with intraoperative cholangiogram this afternoon. Patient understands and is agreeable. Consent obtained.   - Remain NPO for now.   - Will initiate antibiotic therapy with Ciprofloxacin.   - IVF Hydration    - DVT prophylaxis with SCDs  All risks, benefits, and alternatives to above procedure(s) were discussed with the patient and her family, all of their questions were answered to their expressed satisfaction, patient expresses she wishes to proceed, and informed consent was obtained..  Thank you for the opportunity to participate in this patient's care.   -- Lynden Oxford, PA-C [facility] 06/26/2018, 10:44 AM [phone] M-F: 7am - 4pm

## 2018-06-26 NOTE — Plan of Care (Signed)
Sitting up in bed c/o abdominal pain #7/10, medicated per protocol.will continue to monitor.callbelll within reach,spouse at bedside.

## 2018-06-26 NOTE — Anesthesia Preprocedure Evaluation (Signed)
Anesthesia Evaluation  Patient identified by MRN, date of birth, ID band Patient awake    Reviewed: Allergy & Precautions, NPO status , Patient's Chart, lab work & pertinent test results, reviewed documented beta blocker date and time   Airway Mallampati: II  TM Distance: >3 FB     Dental   Pulmonary Current Smoker,    Pulmonary exam normal        Cardiovascular hypertension, Pt. on medications and Pt. on home beta blockers Normal cardiovascular exam     Neuro/Psych PSYCHIATRIC DISORDERS Anxiety Depression negative neurological ROS     GI/Hepatic Neg liver ROS, GERD  Medicated,  Endo/Other  negative endocrine ROS  Renal/GU negative Renal ROS  negative genitourinary   Musculoskeletal negative musculoskeletal ROS (+)   Abdominal Normal abdominal exam  (+)   Peds negative pediatric ROS (+)  Hematology negative hematology ROS (+)   Anesthesia Other Findings   Reproductive/Obstetrics                             Anesthesia Physical Anesthesia Plan  ASA: II  Anesthesia Plan: General   Post-op Pain Management:    Induction: Intravenous  PONV Risk Score and Plan:   Airway Management Planned: Oral ETT  Additional Equipment:   Intra-op Plan:   Post-operative Plan: Extubation in OR  Informed Consent: I have reviewed the patients History and Physical, chart, labs and discussed the procedure including the risks, benefits and alternatives for the proposed anesthesia with the patient or authorized representative who has indicated his/her understanding and acceptance.   Dental advisory given  Plan Discussed with: CRNA and Surgeon  Anesthesia Plan Comments:         Anesthesia Quick Evaluation

## 2018-06-26 NOTE — Anesthesia Procedure Notes (Signed)
Procedure Name: Intubation Date/Time: 06/26/2018 1:03 PM Performed by: Oliva Bustardorbett, Dare Sanger E, CRNA Pre-anesthesia Checklist: Patient identified, Emergency Drugs available, Suction available, Patient being monitored and Timeout performed Patient Re-evaluated:Patient Re-evaluated prior to induction Oxygen Delivery Method: Circle system utilized Preoxygenation: Pre-oxygenation with 100% oxygen Induction Type: IV induction Ventilation: Mask ventilation without difficulty Laryngoscope Size: McGraph and 3 Grade View: Grade I Tube type: Oral Tube size: 7.0 mm Number of attempts: 1 Airway Equipment and Method: Stylet and Video-laryngoscopy Placement Confirmation: ETT inserted through vocal cords under direct vision,  positive ETCO2 and breath sounds checked- equal and bilateral Secured at: 21 cm Tube secured with: Tape Dental Injury: Teeth and Oropharynx as per pre-operative assessment

## 2018-06-26 NOTE — Op Note (Signed)
SURGICAL OPERATIVE REPORT   DATE OF PROCEDURE: 06/26/2018  ATTENDING Surgeon(s): Ancil Linseyavis, Madison Direnzo Evan, MD  ASSISTANT(S): Sonda RumbleAdrienne Allred, RNFA-S, [name], PA-C  ANESTHESIA: GETA  PRE-OPERATIVE DIAGNOSIS: Acute Calculous Cholecystitis (K80.01)  POST-OPERATIVE DIAGNOSIS: Acute Calculous Cholecystitis (K80.01) with cystic duct filled with many small soft yellow biliary stones  PROCEDURE(S): (cpt's: 47563) 1.) Laparoscopic Cholecystectomy 2.) Intra-operative Cholangiogram  INTRAOPERATIVE FINDINGS: Mild pericholecystic inflammation with cystic duct filled with many small soft yellow biliary stones, no common bile duct filling defect(s)/stone(s) appreciated, cystic duct and cystic artery clips well-secured, hemostasis at completion of procedure  INTRAOPERATIVE FLUIDS: 900 mL crystalloid   ESTIMATED BLOOD LOSS: Minimal (<30 mL)   URINE OUTPUT: No foley  SPECIMENS: Gallbladder  IMPLANTS: None  DRAINS: None   COMPLICATIONS: None apparent   CONDITION AT COMPLETION: Hemodynamically stable and extubated  DISPOSITION: PACU   INDICATION(S) FOR PROCEDURE:  Patient is a 52 y.o. female who this admission presented with post-prandial RUQ > epigastric abdominal pain after eating fatty foods in particular. Ultrasound suggested cholelithiasis with gallbladder wall thickening, CBD WNL, but bilirubin mildly elevated to 1.3 with WBC WNL. All risks, benefits, and alternatives to laparoscopic cholecystectomy with intra-operative cholangiogram were discussed with the patient and her husband, patient elected to proceed, and informed consent was accordingly obtained at that time.   DETAILS OF PROCEDURE:  Patient was brought to the operating suite and appropriately identified. General anesthesia was administered along with peri-operative prophylactic IV antibiotics, and endotracheal intubation was performed by anesthesiologist, along with NG/OG tube for gastric decompression. In supine position,  operative site was prepped and draped in usual sterile fashion, and following a brief time out, initial 5 mm incision was made in a natural skin crease just above the umbilicus. Fascia was then elevated, and a Verress needle was inserted and its proper position confirmed using aspiration and saline meniscus test.  Upon insufflation of the abdominal cavity with carbon dioxide to a well-tolerated pressure of 12-15 mmHg, 5 mm peri-umbilical port followed by laparoscope were inserted and used to inspect the abdominal cavity and its contents with no injuries from insertion of the first trochar noted. Three additional trocars were inserted, one at the epigastric position (10 mm) and two along the Right costal margin (5 mm). The table was then placed in reverse Trendelenburg position with the Right side up. Filmy adhesions between the gallbladder and omentum/duodenum/transverse colon were lysed using combined blunt dissection and selective electrocautery. The apex/dome of the gallbladder was grasped with an atraumatic grasper passed through the lateral port and retracted apically over the liver. The infundibulum was also grasped and retracted, exposing Calot's triangle. The peritoneum overlying the gallbladder infundibulum was incised and dissected free of surrounding peritoneal attachments, revealing the cystic duct and cystic artery. Cystic duct was clipped at interface with gallbladder infundibulum, and ductotomy was made using endoshear scissors. Insertion of cholangiocatheter into the cystic duct was attempted repeatedly, but was challenging due to cystic duct packed with many small soft yellow biliary stones, some of which (including a few more moderately sized) were "milked" proximally and expressed out from the ductotomy, taking care to avoid/minimize passage of biliary stones more distally. Ultimately, bile was expressed rather than additional stones, and cholangiocatheter was able to be advanced a short  distance into the cystic duct and secured, through which cholangiogram was performed, revealing widely patent CBD and hepatic bile ducts without filling defect(s) and prompt flow into the duodenum despite several remaining cystic duct stones/filling defects. The cystic duct was then clipped  twice distal to the ductotomy as far distal beyond remaining cystic duct stones as safely possible, and cystic artery was clipped twice on the patient side and once on the gallbladder specimen side close to the gallbladder. The gallbladder was then dissected from its peritoneal attachments to the liver using electrocautery, and the gallbladder was placed into a laparoscopic specimen bag and removed from the abdominal cavity via the epigastric port site. Hemostasis and secure placement of clips were confirmed, and intra-peritoneal cavity was inspected with no additional findings. PMI laparoscopic fascial closure device was then used to re-approximate fascia at the 10 mm epigastric port site.  All ports were then removed under direct visualization, and abdominal cavity was desuflated. All port sites were irrigated/cleaned, additional local anesthetic was injected at each incision, 3-0 Vicryl was used to re-approximate dermis at 10 mm port site(s), and subcuticular 4-0 Monocryl suture was used to re-approximate skin. Skin was then cleaned, dried, and sterile skin glue was applied. Patient was then safely able to be awakened, extubated, and transferred to PACU for post-operative monitoring and care.  Intra-operative findings and implications were discussed with both patient and her family following procedure, will check am LFT's.  I was present for all aspects of the above procedure, and no operative complications were apparent.

## 2018-06-26 NOTE — ED Provider Notes (Signed)
[facility] Emergency Department Provider Note  Time seen: 7:09 AM  I have reviewed the triage vital signs and the nursing notes.   HISTORY  Chief Complaint Abdominal Pain    HPI Monica Moody is a 52 y.o. female with a past medical history of anxiety, depression, gastric reflux, hypertension, hyperlipidemia, presents to the emergency department for upper abdominal pain.  According to the patient approximately 4 hours ago the patient awoke with severe upper abdominal pain, states the pain had been constant until approximately 15 minutes ago when the pain has completely resolved.  Patient denies any nausea, vomiting, diarrhea.  Denies any dysuria or hematuria.  No fever.  No history of upper abdominal pain in the past.   Past Medical History:  Diagnosis Date  . Abscess of chest wall   . Anxiety   . Depression   . GERD (gastroesophageal reflux disease)   . Hyperlipidemia   . Hypertension   . IFG (impaired fasting glucose)   . Obesity   . Palpitations   . Tobacco use     Patient Active Problem List   Diagnosis Date Noted  . Segmental dysfunction of lumbar region 10/18/2016  . Muscle spasm of back 10/18/2016  . Cervical segment dysfunction 10/18/2016  . Abnormal posture 10/18/2016  . Hand pain, right 10/10/2016  . Low back pain 10/10/2016  . Mixed hyperlipidemia 08/22/2016  . BMI 40.0-44.9, adult (HCC) 07/06/2016  . Hypertriglyceridemia 08/31/2015  . Breast cancer screening 08/30/2015  . Vitamin D deficiency 08/30/2015  . Medication monitoring encounter 08/30/2015  . Needs flu shot 08/30/2015  . Essential hypertension   . GERD (gastroesophageal reflux disease)   . Depression   . Anxiety   . Tobacco use   . IFG (impaired fasting glucose)   . Obesity   . Palpitations     Past Surgical History:  Procedure Laterality Date  . BREAST BIOPSY Left    surgical bx  . BREAST LUMPECTOMY Left   . mesh sling implated     due to urethra dropping  .  OVARIAN CYST REMOVAL Right   . TUBAL LIGATION    . Uterine Ablation      Prior to Admission medications   Medication Sig Start Date End Date Taking? Authorizing Provider  cholecalciferol (VITAMIN D) 1000 UNITS tablet Take 1,000 Units by mouth daily. Every other day.    [provider]  DULoxetine (CYMBALTA) 60 MG capsule TAKE 1 CAPSULE BY MOUTH ONCE DAILY. 05/17/16   Particia Nearing, PA-C  fenofibrate (TRICOR) 145 MG tablet Take 1 tablet (145 mg total) by mouth daily. 03/05/18   Kallie Locks, FNP  fluvoxaMINE (LUVOX) 50 MG tablet Take 50 mg by mouth at bedtime.    [provider]  hydrochlorothiazide (HYDRODIURIL) 25 MG tablet Take 1 tablet (25 mg total) by mouth daily. 03/05/18   Kallie Locks, FNP  ibuprofen (ADVIL,MOTRIN) 800 MG tablet Take 1 tablet (800 mg total) by mouth every 8 (eight) hours as needed. 03/05/18   Kallie Locks, FNP  lamoTRIgine (LAMICTAL) 25 MG tablet Take by mouth daily.    [provider]  lansoprazole (PREVACID) 15 MG capsule Take 1 capsule (15 mg total) by mouth daily at 12 noon. 03/05/18   Kallie Locks, FNP  metoprolol tartrate (LOPRESSOR) 50 MG tablet Take 1 tablet (50 mg total) by mouth 2 (two) times daily. 03/05/18   Kallie Locks, FNP  vitamin E 100 UNIT capsule Take by mouth daily.  [provider]    Allergies  Allergen Reactions  . Bupropion Other (See Comments)    Hands peel  . Celexa [Citalopram Hydrobromide] Other (See Comments)    Affects heart  . Penicillins Swelling    Family History  Problem Relation Age of Onset  . Arthritis Mother   . Diabetes Mother   . Heart disease Mother   . Hyperlipidemia Mother   . Hypertension Mother   . Migraines Mother   . Cancer Mother        breast  . Diabetes Father   . Hyperlipidemia Father   . Hypertension Father   . Cancer Father        kidney  . Migraines Brother   . Cancer Maternal Aunt        ovarian  . Cancer Paternal Uncle         intestines  . Cancer Maternal Grandmother        stomach  . COPD Maternal Grandfather   . Stroke Neg Hx     Social History Social History   Tobacco Use  . Smoking status: Current Every Day Smoker    Packs/day: 0.50    Years: 30.00    Pack years: 15.00    Types: Cigarettes  . Smokeless tobacco: Never Used  Substance Use Topics  . Alcohol use: Yes    Comment: once every few months  . Drug use: No    Review of Systems Constitutional: Negative for fever. Cardiovascular: Negative for chest pain. Respiratory: Negative for shortness of breath. Gastrointestinal: Severe upper abdominal aching type pain now completely resolved. Genitourinary: Negative for urinary compaints Musculoskeletal: Negative for musculoskeletal complaints Skin: Negative for skin complaints  Neurological: Negative for headache All other ROS negative  ____________________________________________   PHYSICAL EXAM:  VITAL SIGNS: ED Triage Vitals  Enc Vitals Group     BP 06/26/18 0615 103/82     Pulse Rate 06/26/18 0615 80     Resp 06/26/18 0615 16     Temp 06/26/18 0621 97.7 F (36.5 C)     Temp Source 06/26/18 0621 Oral     SpO2 06/26/18 0615 99 %     Weight 06/26/18 0616 179 lb (81.2 kg)     Height 06/26/18 0616 5\' 3"  (1.6 m)     Head Circumference --      Peak Flow --      Pain Score 06/26/18 0616 8     Pain Loc --      Pain Edu? --      Excl. in GC? --     Constitutional: Alert and oriented. Well appearing and in no distress. Eyes: Normal exam ENT   Head: Normocephalic and atraumatic.   Mouth/Throat: Mucous membranes are moist. Cardiovascular: Normal rate, regular rhythm. No murmur Respiratory: Normal respiratory effort without tachypnea nor retractions. Breath sounds are clear  Gastrointestinal: Soft and nontender. No distention.  Musculoskeletal: Nontender with normal range of motion in all extremities.  Neurologic:  Normal speech and language. No gross focal neurologic  deficits Skin:  Skin is warm, dry and intact.  Psychiatric: Mood and affect are normal.   ____________________________________________   RADIOLOGY  Ultrasound shows multiple gallstones with gallbladder wall thickening suspicious for acute cholecystitis  ____________________________________________   INITIAL IMPRESSION / ASSESSMENT AND PLAN / ED COURSE  Pertinent labs & imaging results that were available during my care of the patient were reviewed by me and considered in my medical decision making (see chart for details).  Patient  presents to the emergency department for mid upper abdominal pain aching type pain severe, now 100% resolved.  Completely benign abdominal exam.  Patient's labs thus far nonrevealing slight AST elevation.  Differential would include biliary colic, gastritis, gastric or peptic ulcers, intestinal type pains, we will check a urinalysis and obtain a right upper quadrant ultrasound as a precaution.  Patient agreeable to plan of care.  Ultrasound suspicious for acute cholecystitis.  However patient states her pain is completely resolved.  Reexamined the patient she continues to have no right upper quadrant tenderness.  Vitals are reassuring.  I discussed with general surgery who will evaluate the patient.  General surgery will be admitting for cholecystectomy.  ____________________________________________   FINAL CLINICAL IMPRESSION(S) / ED DIAGNOSES  Upper abdominal pain Acute cholecystitis   Minna AntisPaduchowski, Mackinze Criado, MD 06/26/18 1031

## 2018-06-26 NOTE — ED Notes (Signed)
Returned from U/S

## 2018-06-26 NOTE — ED Notes (Signed)
Patient transported to [facility] 

## 2018-06-26 NOTE — ED Notes (Signed)
Report to Amber RN.

## 2018-06-26 NOTE — ED Notes (Signed)
Pt signed consent and waiting for surgery, denies any needs at this time, informed her that she will need to take off all jewelry and clothing prior to leaving for surgery. Family at bedside.

## 2018-06-26 NOTE — ED Triage Notes (Signed)
Pt states she woke up early am with mid abdomen pain, denies N,V,D. Tearful in triage.

## 2018-06-26 NOTE — H&P (Addendum)
SURGICAL HISTORY AND PHYSICAL  Patient seen and examined as described below with surgical PA-C, Gillermina PhyZachary Moody.  Assessment/Plan: (ICD-10's: K80.01) In summary, patient is a 52 y.o. female with acute calculous cholecystitis and mild hyperbilirubinemia, likely reactive considering non-dilated CBD, though cannot exclude partially obstructing choledocholithiasis, complicated by pertinent comorbidities including obesity (BMI 32) s/p significant weight loss, HTN, HLD, former borderline DM prior to weight loss, GERD, generalized anxiety disorder, major depression disorder, and chronic ongoing tobacco abuse (smoking).   - NPO, IV fluids   - IV antibiotics (ciprofloxacin due to reported penicillin allergy)  - all risks, benefits, and alternatives to laparoscopic cholecystectomy with intra-operative cholangiogram were discussed with the patient and her family, all of their questions were answered to their expressed satisfaction, patient expresses she wishes to proceed, and informed consent was obtained.  - will plan for laparoscopic cholecystectomy with intra-operative cholangiogram today pending OR availability  - DVT prophylaxis  I have personally reviewed the patient's chart, evaluated/examined the patient, proposed the recommended management, and discussed these recommendations with the patient and her family to their expressed satisfaction as well as with patient's RN and ED physician.  -- Scherrie GerlachJason E. Earlene Plateravis, MD, RPVI [place]: [facility] General Surgery - Partnering for exceptional care. Office: [phone]     SURGICAL HISTORY AND PHYSICAL  HISTORY OF PRESENT ILLNESS (HPI):  52 y.o. female [facility] ED today for evaluation of abdominal pain. Patient reports that she awoke from sleep around 3 AM with severe pain in her RUQ. She described the pain as a sharp waxing and waning squeezing pain. The pain lasted until arrival in the ED around 7 AM when it spontaneously  subsided. Denied taking anything for the pain. She does believe she has had much less severe similar pain over the last 6 months. She does endorse doing the ketogenic diet over the last year. She denied any fevers, chills, CP, SOB, nausea, emesis, bowel changes, or urinary changes.   [facility] physician Dr. Lenard LancePaduchowski, MD in this context for evaluation and management of cholecysitis.  PAST MEDICAL HISTORY (PMH):  Past Medical History:  Diagnosis Date  . Abscess of chest wall   . Anxiety   . Depression   . GERD (gastroesophageal reflux disease)   . Hyperlipidemia   . Hypertension   . IFG (impaired fasting glucose)   . Obesity   . Palpitations   . Tobacco use      PAST SURGICAL HISTORY (PSH):  Past Surgical History:  Procedure Laterality Date  . BREAST BIOPSY Left    surgical bx  . BREAST LUMPECTOMY Left   . mesh sling implated     due to urethra dropping  . OVARIAN CYST REMOVAL Right   . TUBAL LIGATION    . Uterine Ablation       MEDICATIONS:  Prior to Admission medications   Medication Sig Start Date End Date Taking? Authorizing Provider  cholecalciferol (VITAMIN D) 1000 UNITS tablet Take 1,000 Units by mouth daily. Every other day.    [provider]  DULoxetine (CYMBALTA) 60 MG capsule TAKE 1 CAPSULE BY MOUTH ONCE DAILY. 05/17/16   Particia NearingLane, Rachel Elizabeth, PA-C  fenofibrate (TRICOR) 145 MG tablet Take 1 tablet (145 mg total) by mouth daily. 03/05/18   Kallie LocksStroud, Natalie M, FNP  fluvoxaMINE (LUVOX) 50 MG tablet Take 50 mg by mouth at bedtime.    [provider]  hydrochlorothiazide (HYDRODIURIL) 25 MG tablet Take 1 tablet (25 mg total) by  mouth daily. 03/05/18   Kallie Locks, FNP  ibuprofen (ADVIL,MOTRIN) 800 MG tablet Take 1 tablet (800 mg total) by mouth every 8 (eight) hours as needed. 03/05/18   Kallie Locks, FNP  lamoTRIgine (LAMICTAL) 25 MG tablet Take by mouth daily.    [provider]  lansoprazole (PREVACID) 15 MG  capsule Take 1 capsule (15 mg total) by mouth daily at 12 noon. 03/05/18   Kallie Locks, FNP  metoprolol tartrate (LOPRESSOR) 50 MG tablet Take 1 tablet (50 mg total) by mouth 2 (two) times daily. 03/05/18   Kallie Locks, FNP  vitamin E 100 UNIT capsule Take by mouth daily.    [provider]     ALLERGIES:  Allergies  Allergen Reactions  . Bupropion Other (See Comments)    Hands peel  . Celexa [Citalopram Hydrobromide] Other (See Comments)    Affects heart  . Penicillins Swelling    Has patient had a PCN reaction causing immediate rash, facial/tongue/throat swelling, SOB or lightheadedness with hypotension: Yes Has patient had a PCN reaction causing severe rash involving mucus membranes or skin necrosis: No Has patient had a PCN reaction that required hospitalization: No Has patient had a PCN reaction occurring within the last 10 years: Unknown If all of the above answers are "NO", then may proceed with Cephalosporin use.      SOCIAL HISTORY:  Social History   Socioeconomic History  . Marital status: Married    Spouse name: Not on file  . Number of children: Not on file  . Years of education: Not on file  . Highest education level: Not on file  Occupational History  . Not on file  Social Needs  . Financial resource strain: Not on file  . Food insecurity:    Worry: Not on file    Inability: Not on file  . Transportation needs:    Medical: Not on file    Non-medical: Not on file  Tobacco Use  . Smoking status: Current Every Day Smoker    Packs/day: 0.50    Years: 30.00    Pack years: 15.00    Types: Cigarettes  . Smokeless tobacco: Never Used  Substance and Sexual Activity  . Alcohol use: Yes    Comment: once every few months  . Drug use: No  . Sexual activity: Yes    Comment: Says she's had her tubes tied  Lifestyle  . Physical activity:    Days per week: Not on file    Minutes per session: Not on file  . Stress: Not on file  Relationships  .  Social connections:    Talks on phone: Not on file    Gets together: Not on file    Attends religious service: Not on file    Active member of club or organization: Not on file    Attends meetings of clubs or organizations: Not on file    Relationship status: Not on file  . Intimate partner violence:    Fear of current or ex partner: Not on file    Emotionally abused: Not on file    Physically abused: Not on file    Forced sexual activity: Not on file  Other Topics Concern  . Not on file  Social History Narrative  . Not on file    The patient currently resides: Home The patient normally is (ambulatory / bedbound): Ambulatory   FAMILY HISTORY:  Family History  Problem Relation Age of Onset  . Arthritis  Mother   . Diabetes Mother   . Heart disease Mother   . Hyperlipidemia Mother   . Hypertension Mother   . Migraines Mother   . Cancer Mother        breast  . Diabetes Father   . Hyperlipidemia Father   . Hypertension Father   . Cancer Father        kidney  . Migraines Brother   . Cancer Maternal Aunt        ovarian  . Cancer Paternal Uncle        intestines  . Cancer Maternal Grandmother        stomach  . COPD Maternal Grandfather   . Stroke Neg Hx      REVIEW OF SYSTEMS:  Constitutional: denies weight loss, fever, chills, or sweats  Eyes: denies any other vision changes, history of eye injury  ENT: denies sore throat, hearing problems  Respiratory: denies shortness of breath, wheezing  Cardiovascular: denies chest pain, palpitations  Gastrointestinal: + abdominal pain, denies N/V, or diarrhea/and bowel function as per HPI Genitourinary: denies burning with urination or urinary frequency Musculoskeletal: denies any other joint pains or cramps  Skin: denies any other rashes or skin discolorations  Neurological: denies any other headache, dizziness, weakness  Psychiatric: denies any other depression, anxiety   All other review of systems were negative   VITAL  SIGNS:  Temp:  [97.7 F (36.5 C)] 97.7 F (36.5 C) (08/28 0621) Pulse Rate:  [68-80] 68 (08/28 1006) Resp:  [16-18] 18 (08/28 1006) BP: (103-127)/(70-82) 127/70 (08/28 1006) SpO2:  [98 %-99 %] 98 % (08/28 1006) Weight:  [81.2 kg] 81.2 kg (08/28 0616)     Height: 5\' 3"  (160 cm) Weight: 81.2 kg BMI (Calculated): 31.72   INTAKE/OUTPUT:  This shift: No intake/output data recorded.  Last 2 shifts: @IOLAST2SHIFTS @   PHYSICAL EXAM:  Constitutional: -- Obese body habitus  -- Awake, alert, and oriented x3, no apparent distress Eyes:  -- Pupils equally round and reactive to light  -- No scleral icterus, B/L no occular discharge Ear, nose, throat: -- Neck is FROM WNL Pulmonary:  -- No wheezes or rhales -- Equal breath sounds bilaterally -- Breathing non-labored at rest Cardiovascular:  -- HRRR -- S1, S2 present  -- No pericardial rubs  Gastrointestinal:  -- Obese, Abdomen soft, RUQ Tenderness, non-distended, no guarding or rebound tenderness -- Positive Murphy's Sign -- Extremities: B/L UE and LE FROM, hands and feet warm, no edema  Neurologic:  -- Motor function: Intact and symmetric -- Sensation: Intact and symmetric Psychiatric:  -- Mood and affect WNL   Labs:  CBC Latest Ref Rng & Units 06/26/2018 03/05/2018 06/21/2017  WBC 3.6 - 11.0 K/uL 9.2 8.0 7.7  Hemoglobin 12.0 - 16.0 g/dL 16.1 09.6 04.5  Hematocrit 35.0 - 47.0 % 44.0 41.4 40.1  Platelets 150 - 440 K/uL 300 306 248   CMP Latest Ref Rng & Units 06/26/2018 11/01/2017 06/21/2017  Glucose 70 - 99 mg/dL 409(W) 80 74  BUN 6 - 20 mg/dL 13 18 13   Creatinine 0.44 - 1.00 mg/dL 1.19 1.47(W) 2.95  Sodium 135 - 145 mmol/L 141 141 137  Potassium 3.5 - 5.1 mmol/L 3.2(L) 3.7 3.5  Chloride 98 - 111 mmol/L 99 98 94(L)  CO2 22 - 32 mmol/L 30 25 25   Calcium 8.9 - 10.3 mg/dL 9.5 9.7 9.4  Total Protein 6.5 - 8.1 g/dL 7.6 6.7 6.7  Total Bilirubin 0.3 - 1.2 mg/dL 6.2(Z) 0.2 0.3  Alkaline  Phos 38 - 126 U/L 39 41 58  AST 15 - 41 U/L 70(H)  14 18  ALT 0 - 44 U/L 35 13 13    Imaging studies:  [place] RUQ on 06/26/18:   IMPRESSION: Multiple gallstones with gallbladder wall thickening suspicious for acute cholecystitis. Negative sonographic Murphy's sign is noted However.  Assessment/Plan: (ICD-10's: K81.0, E80.7) 52 y.o. female with acute cholecystitis with elevated total bilirubin levels, complicated by pertinent comorbidities including HTN, HLD, and smoking history.   - Given patient's history, gallbladder wall thickening on [place], and elevated T. Bilirubin will plan for Laparoscopic Cholecystectomy with intraoperative cholangiogram this afternoon. Patient understands and is agreeable. Consent obtained.   - Remain NPO for now.   - Will initiate antibiotic therapy with Ciprofloxacin.   - IVF Hydration    - DVT prophylaxis with SCDs  All risks, benefits, and alternatives to above procedure(s) were discussed with the patient and her family, all of their questions were answered to their expressed satisfaction, patient expresses she wishes to proceed, and informed consent was obtained..  Lynden Oxford, PA-C [facility] 06/26/2018, 10:44 AM [phone] M-F: 7am - 4pm

## 2018-06-26 NOTE — ED Notes (Signed)
Surgeon and PA in room to talk to patient. Patient asking about taking home medications, informed her to wait for surgeon to talk to her first.

## 2018-06-27 ENCOUNTER — Encounter: Payer: Self-pay | Admitting: Surgery

## 2018-06-27 DIAGNOSIS — K8000 Calculus of gallbladder with acute cholecystitis without obstruction: Secondary | ICD-10-CM

## 2018-06-27 LAB — COMPREHENSIVE METABOLIC PANEL
ALBUMIN: 3.7 g/dL (ref 3.5–5.0)
ALT: 148 U/L — ABNORMAL HIGH (ref 0–44)
ANION GAP: 5 (ref 5–15)
AST: 187 U/L — ABNORMAL HIGH (ref 15–41)
Alkaline Phosphatase: 48 U/L (ref 38–126)
BILIRUBIN TOTAL: 0.7 mg/dL (ref 0.3–1.2)
BUN: 8 mg/dL (ref 6–20)
CO2: 28 mmol/L (ref 22–32)
Calcium: 8.8 mg/dL — ABNORMAL LOW (ref 8.9–10.3)
Chloride: 106 mmol/L (ref 98–111)
Creatinine, Ser: 0.49 mg/dL (ref 0.44–1.00)
GFR calc Af Amer: >60 mL/min (ref >60)
GFR calc non Af Amer: >60 mL/min (ref >60)
GLUCOSE: 110 mg/dL — AB (ref 70–99)
POTASSIUM: 4 mmol/L (ref 3.5–5.1)
SODIUM: 139 mmol/L (ref 135–145)
TOTAL PROTEIN: 6.2 g/dL — AB (ref 6.5–8.1)

## 2018-06-27 MED ORDER — OXYCODONE-ACETAMINOPHEN 5-325 MG PO TABS: 1.0000 | ORAL_TABLET | ORAL | 0 refills | Status: DC | PRN

## 2018-06-27 NOTE — Discharge Summary (Signed)
Discharge Summary  Patient ID: Monica Moody MRN: [id number] DOB/AGE: 51/11/1965 51 y.o.  Admit date: 06/26/2018 Discharge date: 06/27/2018  Discharge Diagnoses Acute Cholecystitis   Consultants None  Procedures Laparoscopic Cholecystectomy with Intraoperative Cholangiogram with Dr. Satira MccallumJason Davis, MD on 06/27/15  HPI: 52 y.o. female [facility]26/18 for abdominal pain which awoke her from sleep. She had associated nausea, but she denied any fevers, chills, emesis, or diarrhea. Workup was found to be significant for elevated total bilirubin to 1.3 and imaging demonstrated cholelithiasis with gall bladder wall thickening on US.   Hospital Course: Informed consent was obtained and documented, and patient underwent uneventful Laparoscopic cholecystectomy with intraoperative cholangiogram (08, 28/2019).  Post-operatively, patient's pain and symptoms improved, advancement of patient's diet was uneventful and quick,  and ambulation were well-tolerated. The remainder of patient's hospital course was essentially unremarkable, and discharge planning was initiated accordingly with patient safely able to be discharged home with appropriate discharge instructions, pain control, and outpatient follow-up after all of her and family's questions were answered to their expressed satisfaction.     Allergies as of 06/27/2018      Reactions   Bupropion Other (See Comments)   Hands peel   Celexa [citalopram Hydrobromide] Other (See Comments)   Affects heart   Penicillins Swelling   Has patient had a PCN reaction causing immediate rash, facial/tongue/throat swelling, SOB or lightheadedness with hypotension: Yes Has patient had a PCN reaction causing severe rash involving mucus membranes or skin necrosis: No Has patient had a PCN reaction that required hospitalization: No Has patient had a PCN reaction occurring within the last 10 years: Unknown If all of the above answers are "NO", then may proceed  with Cephalosporin use.      Medication List    TAKE these medications   Biotin 9629510000 MCG Tabs Take 2,000 mcg by mouth daily.   cholecalciferol 1000 units tablet Commonly known as:  VITAMIN D Take 1,000 Units by mouth daily.   DULoxetine 60 MG capsule Commonly known as:  CYMBALTA TAKE 1 CAPSULE BY MOUTH ONCE DAILY.   fenofibrate 145 MG tablet Commonly known as:  TRICOR Take 1 tablet (145 mg total) by mouth daily.   hydrochlorothiazide 25 MG tablet Commonly known as:  HYDRODIURIL Take 1 tablet (25 mg total) by mouth daily.   ibuprofen 800 MG tablet Commonly known as:  ADVIL,MOTRIN Take 1 tablet (800 mg total) by mouth every 8 (eight) hours as needed.   lamoTRIgine 150 MG tablet Commonly known as:  LAMICTAL Take 150 mg by mouth daily.   lansoprazole 15 MG capsule Commonly known as:  PREVACID Take 1 capsule (15 mg total) by mouth daily at 12 noon. What changed:  when to take this   metoprolol tartrate 50 MG tablet Commonly known as:  LOPRESSOR Take 1 tablet (50 mg total) by mouth 2 (two) times daily.   oxyCODONE-acetaminophen 5-325 MG tablet Commonly known as:  PERCOCET/ROXICET Take 1 tablet by mouth every 4 (four) hours as needed for severe pain.   vitamin E 400 UNIT capsule Take 400 Units by mouth daily.        Follow-up Information    Ancil Linseyavis, Jason Evan, MD. Schedule an appointment as soon as possible for a visit in 2 week(s).   Specialty:  General Surgery Contact information: [address] [place] [address] [phone]           Signed: Lynden OxfordZachary Shakala Marlatt , PA-C [facility]  06/27/2018, 10:21 AM  [phone] M-F: 7am - 4pm

## 2018-06-27 NOTE — Care Management (Signed)
Patient PCP is with [facility] and she obtains medications from Medication Management. Patient denies needs.

## 2018-06-27 NOTE — Progress Notes (Signed)
Patient discharge teaching given, including activity, diet, follow-up appoints, and medications. Patient verbalized understanding of all discharge instructions. IV access was d/c'd. Vitals are stable. Skin is intact except as charted in most recent assessments. Pt to be escorted out by volunteer, to be driven home by family.  Monica Moody  

## 2018-06-28 LAB — SURGICAL PATHOLOGY

## 2018-07-02 ENCOUNTER — Ambulatory Visit: Payer: Self-pay | Admitting: Family Medicine

## 2018-07-02 VITALS — BP 110/75 | HR 88 | Temp 98.5°F | Wt 177.3 lb

## 2018-07-02 DIAGNOSIS — G8929 Other chronic pain: Secondary | ICD-10-CM

## 2018-07-02 DIAGNOSIS — M25561 Pain in right knee: Secondary | ICD-10-CM

## 2018-07-02 DIAGNOSIS — Z09 Encounter for follow-up examination after completed treatment for conditions other than malignant neoplasm: Secondary | ICD-10-CM

## 2018-07-02 DIAGNOSIS — Z9049 Acquired absence of other specified parts of digestive tract: Secondary | ICD-10-CM

## 2018-07-02 DIAGNOSIS — E785 Hyperlipidemia, unspecified: Secondary | ICD-10-CM

## 2018-07-02 DIAGNOSIS — Z8481 Family history of carrier of genetic disease: Secondary | ICD-10-CM

## 2018-07-02 DIAGNOSIS — I10 Essential (primary) hypertension: Secondary | ICD-10-CM

## 2018-07-02 DIAGNOSIS — Z1239 Encounter for other screening for malignant neoplasm of breast: Secondary | ICD-10-CM

## 2018-07-02 MED ORDER — METOPROLOL TARTRATE 50 MG PO TABS: 50.0000 mg | ORAL_TABLET | Freq: Two times a day (BID) | ORAL | 1 refills | Status: DC

## 2018-07-02 MED ORDER — LAMOTRIGINE 150 MG PO TABS: 150.0000 mg | ORAL_TABLET | Freq: Every day | ORAL | 1 refills | Status: DC

## 2018-07-02 MED ORDER — HYDROCHLOROTHIAZIDE 25 MG PO TABS: 25.0000 mg | ORAL_TABLET | Freq: Every day | ORAL | 1 refills | Status: DC

## 2018-07-02 MED ORDER — FENOFIBRATE 145 MG PO TABS: 145.0000 mg | ORAL_TABLET | Freq: Every day | ORAL | 1 refills | Status: DC

## 2018-07-02 NOTE — Progress Notes (Signed)
Follow Up  Subjective:    Patient ID: Monica Moody, female    DOB: 1965-11-28, 52 y.o.   MRN: [id number]   Chief Complaint  Patient presents with  . Follow-up    HPI  Monica Moody is a 52 year old female with a past medical history of Tobacco Use, Palpitations, Obesity, Hypertension, Hyperlipidemia, GERD, Depression, and Anxiety. She is here today for follow up.   Current Status: Since her last office visit, she has had Laparoscopic Cholecystectomy with Interoperative Cholangiogram on 06/26/2018. She is doing well since her surgery, with minimal pain today. She is no longer taking Percocet for pain. She has follow up appointment on 07/11/2018.  She denies fevers, chills, fatigue, recent infections, weight loss, and night sweats. She has not had any headaches, visual changes, dizziness, and falls. No chest pain, heart palpitations, cough and shortness of breath reported. No reports of GI problems such as nausea, vomiting, diarrhea, and constipation. She has no reports of blood in stools, dysuria and hematuria. No depression or anxiety, and denies suicidal ideations, homicidal ideations, or auditory hallucinations.  Past Medical History:  Diagnosis Date  . Abscess of chest wall   . Anxiety   . Depression   . GERD (gastroesophageal reflux disease)   . Hyperlipidemia   . Hypertension   . IFG (impaired fasting glucose)   . Obesity   . Palpitations   . Tobacco use     Family History  Problem Relation Age of Onset  . Arthritis Mother   . Diabetes Mother   . Heart disease Mother   . Hyperlipidemia Mother   . Hypertension Mother   . Migraines Mother   . Cancer Mother        breast  . Diabetes Father   . Hyperlipidemia Father   . Hypertension Father   . Cancer Father        kidney  . Migraines Brother   . Cancer Maternal Aunt        ovarian  . Cancer Paternal Uncle        intestines  . Cancer Maternal Grandmother        stomach  . COPD Maternal Grandfather   . Stroke Neg Hx      Social History   Socioeconomic History  . Marital status: Married    Spouse name: Not on file  . Number of children: Not on file  . Years of education: Not on file  . Highest education level: Not on file  Occupational History  . Not on file  Social Needs  . Financial resource strain: Not very hard  . Food insecurity:    Worry: Patient refused    Inability: Patient refused  . Transportation needs:    Medical: No    Non-medical: No  Tobacco Use  . Smoking status: Current Every Day Smoker    Packs/day: 0.50    Years: 30.00    Pack years: 15.00    Types: Cigarettes  . Smokeless tobacco: Never Used  Substance and Sexual Activity  . Alcohol use: Yes    Comment: once every few months  . Drug use: No  . Sexual activity: Yes    Comment: Says she's had her tubes tied  Lifestyle  . Physical activity:    Days per week: Patient refused    Minutes per session: Patient refused  . Stress: Patient refused  Relationships  . Social connections:    Talks on phone: Patient refused    Gets together: Patient  refused    Attends religious service: Patient refused    Active member of club or organization: Patient refused    Attends meetings of clubs or organizations: Patient refused    Relationship status: Patient refused  . Intimate partner violence:    Fear of current or ex partner: Patient refused    Emotionally abused: Patient refused    Physically abused: Patient refused    Forced sexual activity: Patient refused  Other Topics Concern  . Not on file  Social History Narrative  . Not on file    Past Surgical History:  Procedure Laterality Date  . BREAST BIOPSY Left    surgical bx  . BREAST LUMPECTOMY Left   . CHOLECYSTECTOMY N/A 06/26/2018   Procedure: LAPAROSCOPIC CHOLECYSTECTOMY WITH INTRAOPERATIVE CHOLANGIOGRAM;  Surgeon: Ancil Linsey, MD;  Location: [facility] ORS;  Service: General;  Laterality: N/A;  . mesh sling implated     due to urethra dropping  . OVARIAN CYST  REMOVAL Right   . TUBAL LIGATION    . Uterine Ablation      Immunization History  Administered Date(s) Administered  . Influenza,inj,Quad PF,6+ Mos 08/30/2015  . Influenza-Unspecified 08/11/2016, 08/09/2017  . Pneumococcal Polysaccharide-23 12/18/2012, 06/27/2018  . Td 10/30/2005    Current Meds  Medication Sig  . Biotin [address] MCG TABS Take 2,000 mcg by mouth daily.  . cholecalciferol (VITAMIN D) 1000 UNITS tablet Take 1,000 Units by mouth daily.   . DULoxetine (CYMBALTA) 60 MG capsule TAKE 1 CAPSULE BY MOUTH ONCE DAILY.  . fenofibrate (TRICOR) 145 MG tablet Take 1 tablet (145 mg total) by mouth daily.  . hydrochlorothiazide (HYDRODIURIL) 25 MG tablet Take 1 tablet (25 mg total) by mouth daily.  Marland Kitchen ibuprofen (ADVIL,MOTRIN) 800 MG tablet Take 1 tablet (800 mg total) by mouth every 8 (eight) hours as needed.  . lamoTRIgine (LAMICTAL) 150 MG tablet Take 1 tablet (150 mg total) by mouth daily.  . lansoprazole (PREVACID) 15 MG capsule Take 1 capsule (15 mg total) by mouth daily at 12 noon. (Patient taking differently: Take 15 mg by mouth daily before breakfast. )  . metoprolol tartrate (LOPRESSOR) 50 MG tablet Take 1 tablet (50 mg total) by mouth 2 (two) times daily.  . vitamin E 400 UNIT capsule Take 400 Units by mouth daily.   . [DISCONTINUED] fenofibrate (TRICOR) 145 MG tablet Take 1 tablet (145 mg total) by mouth daily.  . [DISCONTINUED] hydrochlorothiazide (HYDRODIURIL) 25 MG tablet Take 1 tablet (25 mg total) by mouth daily.  . [DISCONTINUED] lamoTRIgine (LAMICTAL) 150 MG tablet Take 150 mg by mouth daily.   . [DISCONTINUED] metoprolol tartrate (LOPRESSOR) 50 MG tablet Take 1 tablet (50 mg total) by mouth 2 (two) times daily.    Allergies  Allergen Reactions  . Bupropion Other (See Comments)    Hands peel  . Celexa [Citalopram Hydrobromide] Other (See Comments)    Affects heart  . Penicillins Swelling    Has patient had a PCN reaction causing immediate rash, facial/tongue/throat  swelling, SOB or lightheadedness with hypotension: Yes Has patient had a PCN reaction causing severe rash involving mucus membranes or skin necrosis: No Has patient had a PCN reaction that required hospitalization: No Has patient had a PCN reaction occurring within the last 10 years: Unknown If all of the above answers are "NO", then may proceed with Cephalosporin use.     BP 110/75 (BP Location: Left Arm)   Pulse 88   Temp 98.5 F (36.9 C) (Oral)   Wt 177  lb 4.8 oz (80.4 kg)   LMP 04/29/2018 (Approximate)   BMI 31.41 kg/m    Review of Systems  Constitutional: Negative.   HENT: Negative.   Eyes: Negative.   Respiratory: Negative.   Cardiovascular: Negative.   Gastrointestinal: Negative.   Endocrine: Negative.   Genitourinary: Negative.   Musculoskeletal: Negative.   Skin: Negative.   Allergic/Immunologic: Negative.   Neurological: Negative.   Hematological: Negative.   Psychiatric/Behavioral: Negative.    Objective:   Physical Exam  Constitutional: She is oriented to person, place, and time. She appears well-developed and well-nourished.  HENT:  Head: Normocephalic and atraumatic.  Right Ear: External ear normal.  Left Ear: External ear normal.  Nose: Nose normal.  Mouth/Throat: Oropharynx is clear and moist.  Eyes: Pupils are equal, round, and reactive to light. Conjunctivae are normal.  Neck: Normal range of motion. Neck supple.  Cardiovascular: Normal rate, regular rhythm, normal heart sounds and intact distal pulses.  Pulmonary/Chest: Effort normal and breath sounds normal.  Abdominal: Soft. Bowel sounds are normal.  Musculoskeletal: Normal range of motion.  Neurological: She is alert and oriented to person, place, and time.  Skin: Skin is warm and dry. Capillary refill takes less than 2 seconds.  Abdominal incision from recent surgery  Psychiatric: She has a normal mood and affect. Her behavior is normal. Judgment and thought content normal.  Nursing note and  vitals reviewed.  Assessment & Plan:   1. S/P laparoscopic cholecystectomy She is doing well with minimal pain. She is not taking Percocet. She is to report to surgeon if pain does not improve or worsens.   2. Hyperlipidemia, unspecified hyperlipidemia type Stable. Refill.  - fenofibrate (TRICOR) 145 MG tablet; Take 1 tablet (145 mg total) by mouth daily.  Dispense: 90 tablet; Refill: 1 - Lipid Profile  3. Essential hypertension Antihypertensive medication is effective. She will continue Metoprolol and HCTZ as prescribed. Refil meds. She will continue to decrease high sodium intake, excessive alcohol intake, increase potassium intake, smoking cessation, and increase physical activity of at least 30 minutes of cardio activity daily. She will continue to follow Heart Healthy or DASH diet.  - hydrochlorothiazide (HYDRODIURIL) 25 MG tablet; Take 1 tablet (25 mg total) by mouth daily.  Dispense: 90 tablet; Refill: 1 - metoprolol tartrate (LOPRESSOR) 50 MG tablet; Take 1 tablet (50 mg total) by mouth 2 (two) times daily.  Dispense: 180 tablet; Refill: 1  4. Chronic pain of right knee Stable.   5. Family history of breast cancer gene mutation in first degree relative - MM Digital Diagnostic Bilat; Future  6. Breast cancer screening  7. Follow up She will follow up in 6 months.   Meds ordered this encounter  Medications  . fenofibrate (TRICOR) 145 MG tablet    Sig: Take 1 tablet (145 mg total) by mouth daily.    Dispense:  90 tablet    Refill:  1  . hydrochlorothiazide (HYDRODIURIL) 25 MG tablet    Sig: Take 1 tablet (25 mg total) by mouth daily.    Dispense:  90 tablet    Refill:  1  . lamoTRIgine (LAMICTAL) 150 MG tablet    Sig: Take 1 tablet (150 mg total) by mouth daily.    Dispense:  90 tablet    Refill:  1  . metoprolol tartrate (LOPRESSOR) 50 MG tablet    Sig: Take 1 tablet (50 mg total) by mouth 2 (two) times daily.    Dispense:  180 tablet    Refill:  1   Raliegh Ip,  MSN, FNP-C [facility] [address] [place], [place] [address] [phone]

## 2018-07-03 LAB — LIPID PANEL
Chol/HDL Ratio: 7.8 ratio — ABNORMAL HIGH (ref 0.0–4.4)
Cholesterol, Total: 235 mg/dL — ABNORMAL HIGH (ref 100–199)
HDL: 30 mg/dL — ABNORMAL LOW (ref >39)
Triglycerides: 519 mg/dL — ABNORMAL HIGH (ref 0–149)

## 2018-07-09 ENCOUNTER — Other Ambulatory Visit: Payer: Self-pay | Admitting: Adult Health Nurse Practitioner

## 2018-07-10 ENCOUNTER — Telehealth: Payer: Self-pay | Admitting: Family Medicine

## 2018-07-10 ENCOUNTER — Other Ambulatory Visit: Payer: Self-pay | Admitting: Internal Medicine

## 2018-07-10 ENCOUNTER — Telehealth: Payer: Self-pay

## 2018-07-10 NOTE — Telephone Encounter (Signed)
-----   Message from Kallie Locks, FNP sent at 07/10/2018 11:04 AM EDT ----- Regarding: "Lab Results" Monica Moody,   Please inform patient that Lipid panel is stable. She needs to continue a low-fat diet and continue Tricor as prescribe. She needs to keep follow up appointment for 12/2018.  Thank you.

## 2018-07-10 NOTE — Telephone Encounter (Signed)
Called pt to give lab results. No answer. Left msg.

## 2018-07-11 ENCOUNTER — Encounter: Payer: Self-pay | Admitting: Surgery

## 2018-07-11 ENCOUNTER — Ambulatory Visit (INDEPENDENT_AMBULATORY_CARE_PROVIDER_SITE_OTHER): Payer: Self-pay | Admitting: Surgery

## 2018-07-11 VITALS — BP 135/83 | HR 82 | Temp 98.1°F | Ht 63.0 in | Wt 180.0 lb

## 2018-07-11 DIAGNOSIS — Z4889 Encounter for other specified surgical aftercare: Secondary | ICD-10-CM

## 2018-07-11 NOTE — Patient Instructions (Addendum)
Please call if you have questions or concerns.   ENERAL POST-OPERATIVE PATIENT INSTRUCTIONS   WOUND CARE INSTRUCTIONS:  Keep a dry clean dressing on the wound if there is drainage. The initial bandage may be removed after 24 hours.  Once the wound has quit draining you may leave it open to air.  If clothing rubs against the wound or causes irritation and the wound is not draining you may cover it with a dry dressing during the daytime.  Try to keep the wound dry and avoid ointments on the wound unless directed to do so.  If the wound becomes bright red and painful or starts to drain infected material that is not clear, please contact your physician immediately.  If the wound is mildly pink and has a thick firm ridge underneath it, this is normal, and is referred to as a healing ridge.  This will resolve over the next 4-6 weeks.  BATHING: You may shower if you have been informed of this by your surgeon. However, Please do not submerge in a tub, hot tub, or pool until incisions are completely sealed or have been told by your surgeon that you may do so.  DIET:  You may eat any foods that you can tolerate.  It is a good idea to eat a high fiber diet and take in plenty of fluids to prevent constipation.  If you do become constipated you may want to take a mild laxative or take ducolax tablets on a daily basis until your bowel habits are regular.  Constipation can be very uncomfortable, along with straining, after recent surgery.  ACTIVITY:  You are encouraged to cough and deep breath or use your incentive spirometer if you were given one, every 15-30 minutes when awake.  This will help prevent respiratory complications and low grade fevers post-operatively if you had a general anesthetic.  You may want to hug a pillow when coughing and sneezing to add additional support to the surgical area, if you had abdominal or chest surgery, which will decrease pain during these times.  You are encouraged to walk and  engage in light activity for the next two weeks.  You should not lift more than 20 pounds, until 08/07/2018 as it could put you at increased risk for complications.  Twenty pounds is roughly equivalent to a plastic bag of groceries. At that time- Listen to your body when lifting, if you have pain when lifting, stop and then try again in a few days. Soreness after doing exercises or activities of daily living is normal as you get back in to your normal routine.  MEDICATIONS:  Try to take narcotic medications and anti-inflammatory medications, such as tylenol, ibuprofen, naprosyn, etc., with food.  This will minimize stomach upset from the medication.  Should you develop nausea and vomiting from the pain medication, or develop a rash, please discontinue the medication and contact your physician.  You should not drive, make important decisions, or operate machinery when taking narcotic pain medication.  SUNBLOCK Use sun block to incision area over the next year if this area will be exposed to sun. This helps decrease scarring and will allow you avoid a permanent darkened area over your incision.    QUESTIONS:  Please feel free to call our office if you have any questions, and we will be glad to assist you. [phone]

## 2018-07-15 ENCOUNTER — Encounter: Payer: Self-pay | Admitting: Surgery

## 2018-07-15 NOTE — Progress Notes (Signed)
Surgical Clinic Progress/Follow-up Note   HPI:  52 y.o. Female presents to clinic for post-op follow-up 2 weeks s/p laparoscopic cholecystectomy with intra-operative cholangiogram Earlene Plater([place], 06/26/2018). Patient reports complete resolution of pre-operative pain and has been tolerating regular diet with +flatus and normal BM's, denies N/V, fever/chills, CP, or SOB.  Review of Systems:  Constitutional: denies fever/chills  Respiratory: denies shortness of breath, wheezing  Cardiovascular: denies chest pain, palpitations  Gastrointestinal: abdominal pain, N/V, and bowel function as per interval history Skin: Denies any other rashes or skin discolorations except post-surgical wounds as per interval history  Vital Signs:  BP 135/83   Pulse 82   Temp 98.1 F (36.7 C) (Skin)   Ht 5\' 3"  (1.6 m)   Wt 180 lb (81.6 kg)   BMI 31.89 kg/m    Physical Exam:  Constitutional:  -- Overweight body habitus  -- Awake, alert, and oriented x3  Pulmonary:  -- No crackles -- Equal breath sounds bilaterally -- Breathing non-labored at rest Cardiovascular:  -- S1, S2 present  -- No pericardial rubs  Gastrointestinal:  -- Soft and non-distended, minimal Right of epigastric peri-incisional tenderness to palpation, no guarding/rebound tenderness -- Post-surgical incisions all well-approximated without any peri-incisional erythema or drainage -- No abdominal masses appreciated, pulsatile or otherwise  Musculoskeletal / Integumentary:  -- Wounds or skin discoloration: None appreciated except post-surgical incisions as described above (GI) -- Extremities: B/L UE and LE FROM, hands and feet warm, no edema   Assessment:  52 y.o. yo Female with a problem list including...  Patient Active Problem List   Diagnosis Date Noted  . Acute cholecystitis due to biliary calculus 06/26/2018  . Segmental dysfunction of lumbar region 10/18/2016  . Muscle spasm of back 10/18/2016  . Cervical segment dysfunction  10/18/2016  . Abnormal posture 10/18/2016  . Hand pain, right 10/10/2016  . Low back pain 10/10/2016  . Mixed hyperlipidemia 08/22/2016  . BMI 40.0-44.9, adult (HCC) 07/06/2016  . Hypertriglyceridemia 08/31/2015  . Breast cancer screening 08/30/2015  . Vitamin D deficiency 08/30/2015  . Medication monitoring encounter 08/30/2015  . Needs flu shot 08/30/2015  . Essential hypertension   . GERD (gastroesophageal reflux disease)   . Depression   . Anxiety   . Tobacco use   . IFG (impaired fasting glucose)   . Obesity   . Palpitations     presents to clinic for post-op follow-up evaluation, doing well 2 weeks s/p laparoscopic cholecystectomy with intra-operative cholngiogram Earlene Plater([place], 06/26/2018) for acute calculous cholecystitis with cystic duct biliary stones.  Plan:              - advance diet as tolerated              - okay to submerge incisions under water (baths, swimming) prn             - gradually resume all activities without restrictions over next 2 weeks             - apply sunblock particularly to incisions with sun exposure to reduce pigmentation of scars             - return to clinic as needed, instructed to call office if any questions or concerns  All of the above recommendations were discussed with the patient, and all of patient's questions were answered to her expressed satisfaction.  -- Scherrie GerlachJason E. Earlene Plateravis, MD, RPVI [place]: [facility] - Partnering for exceptional care. Office: [phone]

## 2018-08-01 ENCOUNTER — Telehealth: Payer: Self-pay | Admitting: Pharmacist

## 2018-08-01 NOTE — Telephone Encounter (Signed)
08/01/2018 12:07:38 PM - Cymbalta refill  08/01/18 I am taking a Lilly refill request to Dr. [place] Sink @ [facility] to sign for Cymbalta 60mg  Take one capsule by mouth once daily.Forde Radon

## 2018-09-05 ENCOUNTER — Ambulatory Visit: Payer: Self-pay

## 2018-09-05 ENCOUNTER — Encounter: Payer: Self-pay | Admitting: Adult Health

## 2018-09-05 ENCOUNTER — Ambulatory Visit: Payer: Self-pay | Admitting: Adult Health

## 2018-09-05 VITALS — BP 137/82 | HR 76 | Temp 98.3°F | Ht 63.0 in | Wt 174.0 lb

## 2018-09-05 DIAGNOSIS — J42 Unspecified chronic bronchitis: Secondary | ICD-10-CM

## 2018-09-05 DIAGNOSIS — Z23 Encounter for immunization: Secondary | ICD-10-CM

## 2018-09-05 DIAGNOSIS — J019 Acute sinusitis, unspecified: Principal | ICD-10-CM | POA: Insufficient documentation

## 2018-09-05 DIAGNOSIS — J209 Acute bronchitis, unspecified: Secondary | ICD-10-CM

## 2018-09-05 DIAGNOSIS — B9689 Other specified bacterial agents as the cause of diseases classified elsewhere: Secondary | ICD-10-CM

## 2018-09-05 DIAGNOSIS — Z720 Tobacco use: Secondary | ICD-10-CM

## 2018-09-05 MED ORDER — PREDNISONE 10 MG (21) PO TBPK: ORAL_TABLET | ORAL | 0 refills | Status: DC

## 2018-09-05 MED ORDER — IBUPROFEN 800 MG PO TABS: 800.0000 mg | ORAL_TABLET | Freq: Three times a day (TID) | ORAL | 1 refills | Status: DC | PRN

## 2018-09-05 MED ORDER — LAMOTRIGINE 150 MG PO TABS: 150.0000 mg | ORAL_TABLET | Freq: Every day | ORAL | 2 refills | Status: DC

## 2018-09-05 MED ORDER — LEVOFLOXACIN 750 MG PO TABS: 750.0000 mg | ORAL_TABLET | Freq: Every day | ORAL | 0 refills | Status: DC

## 2018-09-05 MED ORDER — DULOXETINE HCL 60 MG PO CPEP: 60.0000 mg | ORAL_CAPSULE | Freq: Every day | ORAL | 2 refills | Status: DC

## 2018-09-05 MED ORDER — BENZONATATE 100 MG PO CAPS: 100.0000 mg | ORAL_CAPSULE | Freq: Three times a day (TID) | ORAL | 0 refills | Status: DC

## 2018-09-05 NOTE — Progress Notes (Signed)
Patient: Monica Moody Female    DOB: 1966-05-16   52 y.o.   MRN: [id number] Visit Date: 09/05/2018  Today's Provider: Shawn Route, NP   Chief Complaint  Patient presents with  . Cough    Complains of head/chest congestion, productive cough, H/A, fevers in evenings x 1 week   Subjective:    HPI 52 year old female presented with cough, congestion, runny nose x1 week.  Symptoms gradually and have gotten worse over time.  Cough is productive of yellow sputum is yellow and nasal drainage is also yellow.  Associated symptoms include difficulty sleeping, headache, sinus drainage and congestion.  She took over-the-counter Coricidin and Mucinex without any relief. she reports a low-grade fever.  Today she is not febrile.  Allergies  Allergen Reactions  . Bupropion Other (See Comments)    Hands peel  . Celexa [Citalopram Hydrobromide] Other (See Comments)    Affects heart  . Penicillins Swelling    Has patient had a PCN reaction causing immediate rash, facial/tongue/throat swelling, SOB or lightheadedness with hypotension: Yes Has patient had a PCN reaction causing severe rash involving mucus membranes or skin necrosis: No Has patient had a PCN reaction that required hospitalization: No Has patient had a PCN reaction occurring within the last 10 years: Unknown If all of the above answers are "NO", then may proceed with Cephalosporin use.    Previous Medications   BIOTIN 46962 MCG TABS    Take 2,000 mcg by mouth daily.   CHOLECALCIFEROL (VITAMIN D) 1000 UNITS TABLET    Take 1,000 Units by mouth daily.    DULOXETINE (CYMBALTA) 60 MG CAPSULE    TAKE 1 CAPSULE BY MOUTH ONCE DAILY.   FENOFIBRATE (TRICOR) 145 MG TABLET    Take 1 tablet (145 mg total) by mouth daily.   HYDROCHLOROTHIAZIDE (HYDRODIURIL) 25 MG TABLET    Take 1 tablet (25 mg total) by mouth daily.   IBUPROFEN (ADVIL,MOTRIN) 800 MG TABLET    Take 1 tablet (800 mg total) by mouth every 8 (eight) hours as needed.   LAMOTRIGINE  (LAMICTAL) 150 MG TABLET    Take 1 tablet (150 mg total) by mouth daily.   LANSOPRAZOLE (PREVACID) 15 MG CAPSULE    Take 1 capsule (15 mg total) by mouth daily at 12 noon.   METOPROLOL TARTRATE (LOPRESSOR) 50 MG TABLET    Take 1 tablet (50 mg total) by mouth 2 (two) times daily.   VITAMIN E 400 UNIT CAPSULE    Take 400 Units by mouth daily.     Review of Systems  Constitutional: Negative.   Eyes: Negative.   Respiratory: Positive for cough, chest tightness, shortness of breath and wheezing.   Cardiovascular: Negative.   Musculoskeletal: Negative.   Skin: Negative.   Neurological: Negative.     Social History   Tobacco Use  . Smoking status: Current Every Day Smoker    Packs/day: 0.50    Years: 30.00    Pack years: 15.00    Types: Cigarettes  . Smokeless tobacco: Never Used  Substance Use Topics  . Alcohol use: Yes    Comment: once every few months   Objective:   BP 137/82 (BP Location: Left Arm, Patient Position: Sitting)   Pulse 76   Temp 98.3 F (36.8 C) (Oral)   Ht 5\' 3"  (1.6 m)   Wt 174 lb (78.9 kg)   BMI 30.82 kg/m   Physical Exam  Constitutional: She is oriented to person, place, and time. She appears well-developed and  well-nourished.  HENT:  Mouth/Throat: Oropharynx is clear and moist.  Pain with palpation of frontal sinuses  Eyes: Pupils are equal, round, and reactive to light. Conjunctivae and EOM are normal.  Cardiovascular: Normal rate, regular rhythm, normal heart sounds and intact distal pulses.  Pulmonary/Chest: She is in respiratory distress. She has wheezes.  Abdominal: Soft. Bowel sounds are normal.  Musculoskeletal: Normal range of motion.  Neurological: She is alert and oriented to person, place, and time.  Skin: Skin is warm and dry.  Nursing note and vitals reviewed.       Assessment & Plan:  1. Acute bacterial sinusitis Given patient has long-standing history of smoking, will treat with empiric antibiotics and a steroid taper.   Prednisone taper and Levaquin 750 mg daily x7 days.  Return to clinic in 1 week for reevaluation 2. Acute exacerbation of chronic bronchitis (HCC) See #1 3. Tobacco use Smoking cessation advice given - Ambulatory referral to Smoking Cessation Program   Shawn Route, NP   [facility]

## 2018-09-06 NOTE — Addendum Note (Signed)
Addended by: Benjamin Stain on: 09/06/2018 10:22 AM   Modules accepted: Orders

## 2018-09-12 ENCOUNTER — Ambulatory Visit: Payer: Self-pay | Admitting: Adult Health

## 2018-09-19 ENCOUNTER — Encounter: Payer: Self-pay | Admitting: Adult Health

## 2018-09-19 ENCOUNTER — Ambulatory Visit: Payer: Self-pay | Admitting: Adult Health

## 2018-09-19 VITALS — BP 118/75 | HR 85 | Temp 98.2°F | Wt 169.4 lb

## 2018-09-19 DIAGNOSIS — J42 Unspecified chronic bronchitis: Secondary | ICD-10-CM

## 2018-09-19 DIAGNOSIS — I10 Essential (primary) hypertension: Secondary | ICD-10-CM

## 2018-09-19 DIAGNOSIS — J019 Acute sinusitis, unspecified: Secondary | ICD-10-CM

## 2018-09-19 DIAGNOSIS — J209 Acute bronchitis, unspecified: Secondary | ICD-10-CM

## 2018-09-19 DIAGNOSIS — M25461 Effusion, right knee: Secondary | ICD-10-CM

## 2018-09-19 DIAGNOSIS — B9689 Other specified bacterial agents as the cause of diseases classified elsewhere: Secondary | ICD-10-CM

## 2018-09-19 DIAGNOSIS — M25561 Pain in right knee: Principal | ICD-10-CM

## 2018-09-19 MED ORDER — DICLOFENAC SODIUM 1 % TD GEL: 2.0000 g | Freq: Four times a day (QID) | TRANSDERMAL | 3 refills | Status: DC

## 2018-09-19 NOTE — Progress Notes (Signed)
Patient: Monica Moody Female    DOB: 04/22/1966   52 y.o.   MRN: [id number] Visit Date: 09/19/2018  Today's Provider: Shawn RouteMagddalene S Tukov-Yual, NP   Chief Complaint  Patient presents with  . Follow-up    continues with right knee pain, requesting to see orthopedist   Subjective:    HPI: 52 Y/O female who presents for f/u of bronchitis/sinusitis and right knee pain. Respiratory symptoms have resolved. Right knee pain has gotten worse to the point where it is interfering with her sleep.  She rates the pain as 6-7 on 10, steady, and worse with ambulation.  She report injuring it about 1 year ago and was seen and evaluated in the ED. At the time, she was diagnosed with a knee sprain. She has been taking otc NSAIDs without any significant improvement.  She also has no other complaints.  She is taking all her medications for her chronic health problems as prescribed.  She continues to smoke and is working on quitting.  She does not need any refills today   Allergies  Allergen Reactions  . Bupropion Other (See Comments)    Hands peel  . Celexa [Citalopram Hydrobromide] Other (See Comments)    Affects heart  . Penicillins Swelling    Has patient had a PCN reaction causing immediate rash, facial/tongue/throat swelling, SOB or lightheadedness with hypotension: Yes Has patient had a PCN reaction causing severe rash involving mucus membranes or skin necrosis: No Has patient had a PCN reaction that required hospitalization: No Has patient had a PCN reaction occurring within the last 10 years: Unknown If all of the above answers are "NO", then may proceed with Cephalosporin use.    Previous Medications   BENZONATATE (TESSALON PERLES) 100 MG CAPSULE    Take 1 capsule (100 mg total) by mouth 3 (three) times daily.   BIOTIN [id number] MCG TABS    Take 2,000 mcg by mouth daily.   CHOLECALCIFEROL (VITAMIN D) 1000 UNITS TABLET    Take 1,000 Units by mouth daily.    DULOXETINE (CYMBALTA) 60 MG CAPSULE    Take 1  capsule (60 mg total) by mouth daily.   FENOFIBRATE (TRICOR) 145 MG TABLET    Take 1 tablet (145 mg total) by mouth daily.   HYDROCHLOROTHIAZIDE (HYDRODIURIL) 25 MG TABLET    Take 1 tablet (25 mg total) by mouth daily.   IBUPROFEN (ADVIL,MOTRIN) 800 MG TABLET    Take 1 tablet (800 mg total) by mouth every 8 (eight) hours as needed.   LAMOTRIGINE (LAMICTAL) 150 MG TABLET    Take 1 tablet (150 mg total) by mouth daily.   LANSOPRAZOLE (PREVACID) 15 MG CAPSULE    Take 1 capsule (15 mg total) by mouth daily at 12 noon.   LEVOFLOXACIN (LEVAQUIN) 750 MG TABLET    Take 1 tablet (750 mg total) by mouth daily.   METOPROLOL TARTRATE (LOPRESSOR) 50 MG TABLET    Take 1 tablet (50 mg total) by mouth 2 (two) times daily.   PREDNISONE (STERAPRED UNI-PAK 21 TAB) 10 MG (21) TBPK TABLET    Use as directed   VITAMIN E 400 UNIT CAPSULE    Take 400 Units by mouth daily.     Review of Systems  Constitutional: Negative.   HENT: Negative for congestion, postnasal drip, rhinorrhea, sinus pressure, sinus pain and sore throat.   Respiratory: Negative for shortness of breath and wheezing.   Cardiovascular: Negative.   Gastrointestinal: Negative.   Endocrine: Negative.   Musculoskeletal: Positive  for joint swelling (Right knee pain and swelling). Negative for myalgias.  Skin: Negative.   Neurological: Negative.     Social History   Tobacco Use  . Smoking status: Current Every Day Smoker    Packs/day: 0.50    Years: 30.00    Pack years: 15.00    Types: Cigarettes  . Smokeless tobacco: Never Used  Substance Use Topics  . Alcohol use: Yes    Comment: once every few months   Objective:   BP 118/75 (BP Location: Left Arm, Patient Position: Sitting, Cuff Size: Normal)   Pulse 85   Temp 98.2 F (36.8 C) (Oral)   Wt 169 lb 6.4 oz (76.8 kg)   BMI 30.01 kg/m   Physical Exam  Constitutional: She is oriented to person, place, and time. She appears well-developed and well-nourished.  HENT:  Mouth/Throat:  Oropharynx is clear and moist.  Eyes: Pupils are equal, round, and reactive to light. Conjunctivae and EOM are normal.  Cardiovascular: Normal rate, regular rhythm, normal heart sounds and intact distal pulses.  Pulmonary/Chest: Effort normal and breath sounds normal.  Abdominal: Soft. Bowel sounds are normal.  Musculoskeletal: She exhibits tenderness (Pain with palpation of the right knee) and deformity (Right knee swollen, crepitations with range of motion).  Neurological: She is alert and oriented to person, place, and time.  Skin: Skin is warm and dry.  Nursing note and vitals reviewed.  Assessment & Plan:  1. Pain and swelling of right knee Referral to Ortho Diclofenac 1% gel topically 4 times daily  2. Essential hypertension Continue current medications.  3. Acute exacerbation of chronic bronchitis (HCC) Resolved with antibiotics and steroids  4. Acute bacterial sinusitis Resolved with antibiotics and steroids 5.  Tobacco use disorder: Smoking cessation advice given  Shawn Route, NP   [facility]

## 2018-10-01 ENCOUNTER — Ambulatory Visit: Payer: Self-pay | Admitting: Specialist

## 2018-10-08 ENCOUNTER — Telehealth: Payer: Self-pay | Admitting: Adult Health Nurse Practitioner

## 2018-10-08 NOTE — Telephone Encounter (Signed)
Called and left message for pt to return call.  Pt's appt on 12/17 needs to be rescheduled to 1/7 or later

## 2018-10-08 NOTE — Telephone Encounter (Signed)
Was returning our call from a voicemail that said we have a message to give. Needs to be called back with information (possibly lab results).

## 2018-10-15 ENCOUNTER — Ambulatory Visit: Payer: Self-pay | Admitting: Specialist

## 2018-10-17 ENCOUNTER — Encounter: Payer: Self-pay | Admitting: Adult Health

## 2018-10-17 ENCOUNTER — Ambulatory Visit: Payer: Self-pay | Admitting: Adult Health

## 2018-10-17 VITALS — BP 122/84 | HR 82 | Resp 14

## 2018-10-17 DIAGNOSIS — N939 Abnormal uterine and vaginal bleeding, unspecified: Secondary | ICD-10-CM | POA: Insufficient documentation

## 2018-10-17 NOTE — Progress Notes (Signed)
Patient: Monica Moody Female    DOB: 01/24/1966   52 y.o.   MRN: [id number] Visit Date: 10/17/2018  Today's Provider: Shawn RouteMagddalene S Tukov-Yual, NP   No chief complaint on file.  Subjective:    HPI  52 y/o female who presents with vaginal bleeding x 3 weeks. Bleeding got worse to the point where she was changing pads every hour and pass out blood clots. It appears to be subsiding now but she still sues at least 3 pads a day. She has sporadic normal flowing periods at baseline. She denies any cramping. Bleeding is worse with intercourse. She had a uterine ablation 15 years ago   Allergies  Allergen Reactions  . Bupropion Other (See Comments)    Hands peel  . Celexa [Citalopram Hydrobromide] Other (See Comments)    Affects heart  . Penicillins Swelling    Has patient had a PCN reaction causing immediate rash, facial/tongue/throat swelling, SOB or lightheadedness with hypotension: Yes Has patient had a PCN reaction causing severe rash involving mucus membranes or skin necrosis: No Has patient had a PCN reaction that required hospitalization: No Has patient had a PCN reaction occurring within the last 10 years: Unknown If all of the above answers are "NO", then may proceed with Cephalosporin use.    Previous Medications   BENZONATATE (TESSALON PERLES) 100 MG CAPSULE    Take 1 capsule (100 mg total) by mouth 3 (three) times daily.   BIOTIN [id number] MCG TABS    Take 2,000 mcg by mouth daily.   CHOLECALCIFEROL (VITAMIN D) 1000 UNITS TABLET    Take 1,000 Units by mouth daily.    DICLOFENAC SODIUM (VOLTAREN) 1 % GEL    Apply 2 g topically 4 (four) times daily.   DULOXETINE (CYMBALTA) 60 MG CAPSULE    Take 1 capsule (60 mg total) by mouth daily.   FENOFIBRATE (TRICOR) 145 MG TABLET    Take 1 tablet (145 mg total) by mouth daily.   HYDROCHLOROTHIAZIDE (HYDRODIURIL) 25 MG TABLET    Take 1 tablet (25 mg total) by mouth daily.   IBUPROFEN (ADVIL,MOTRIN) 800 MG TABLET    Take 1 tablet (800 mg total) by  mouth every 8 (eight) hours as needed.   LAMOTRIGINE (LAMICTAL) 150 MG TABLET    Take 1 tablet (150 mg total) by mouth daily.   LANSOPRAZOLE (PREVACID) 15 MG CAPSULE    Take 1 capsule (15 mg total) by mouth daily at 12 noon.   LEVOFLOXACIN (LEVAQUIN) 750 MG TABLET    Take 1 tablet (750 mg total) by mouth daily.   METOPROLOL TARTRATE (LOPRESSOR) 50 MG TABLET    Take 1 tablet (50 mg total) by mouth 2 (two) times daily.   PREDNISONE (STERAPRED UNI-PAK 21 TAB) 10 MG (21) TBPK TABLET    Use as directed   VITAMIN E 400 UNIT CAPSULE    Take 400 Units by mouth daily.     Review of Systems  Constitutional: Negative.   Respiratory: Negative.   Cardiovascular: Negative.   Gastrointestinal: Negative.   Endocrine: Negative.   Genitourinary: Positive for dyspareunia and vaginal bleeding.  Musculoskeletal: Negative.   Skin: Negative.     Social History   Tobacco Use  . Smoking status: Current Every Day Smoker    Packs/day: 0.50    Years: 30.00    Pack years: 15.00    Types: Cigarettes  . Smokeless tobacco: Never Used  Substance Use Topics  . Alcohol use: Yes    Comment: once every  few months   Objective:   There were no vitals taken for this visit.  Physical Exam Vitals signs and nursing note reviewed.  Constitutional:      Appearance: Normal appearance.  Neck:     Musculoskeletal: Normal range of motion.  Cardiovascular:     Rate and Rhythm: Normal rate and regular rhythm.     Pulses: Normal pulses.     Heart sounds: Normal heart sounds.  Pulmonary:     Effort: Pulmonary effort is normal.     Breath sounds: Normal breath sounds.  Abdominal:     General: Bowel sounds are normal.     Palpations: Abdomen is soft.  Neurological:     General: No focal deficit present.     Mental Status: She is alert and oriented to person, place, and time.       Assessment & Plan:  1. Abnormal uterine bleeding Advised to see OB/GYN and to go to the ED if symptoms worsen. Since bleeding is  improving, I will not put her on any medications.  - Ambulatory referral to Obstetrics / Gynecology  Shawn RouteMagddalene S Tukov-Yual, NP   [facility]

## 2018-11-08 ENCOUNTER — Encounter: Payer: Self-pay | Admitting: *Deleted

## 2018-11-08 ENCOUNTER — Encounter: Payer: Self-pay | Admitting: Adult Health

## 2018-11-12 ENCOUNTER — Ambulatory Visit: Payer: Self-pay | Admitting: Specialist

## 2018-11-12 DIAGNOSIS — M25561 Pain in right knee: Principal | ICD-10-CM

## 2018-11-12 DIAGNOSIS — M25461 Effusion, right knee: Secondary | ICD-10-CM

## 2018-11-12 NOTE — Addendum Note (Signed)
Addended by: Nolberto Hanlon on: 11/12/2018 04:13 PM   Modules accepted: Orders

## 2018-11-12 NOTE — Progress Notes (Signed)
  Subjective:     Patient ID: Monica Moody, female   DOB: 06/03/1966, 53 y.o.   MRN: [id number]  HPI 52 y.o. was kneeling in October 2018 when she arose her RT knee locked. Since she has had global RT knee pain but maximum tenderness is anterior.  Unable to squat. Steps one at a time more problem ascending than descending.  Works in childcare with toddlers, knee swells.  RX for IBF to no avail.  X-Rays were normal.   Review of Systems     Objective:   Physical Exam  Gait; slightly antalgic to the right heel/toe ok. Declines to do a squat bc of pain on exam she has slight degree of genuvalgum right>left on her left, she has full active extension with monilially p/f crepitsus.  On her right, she has full active extension but slowly with minimal crepitsus but audible P/F clunk  Supine, she does have a 1+ effusion. She has tu/ PROM on right with palpabable crepitus. There is  No L/M laxity. The Lachman test is neg. She is not TIP over either joint line.     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

## 2018-12-10 ENCOUNTER — Other Ambulatory Visit: Payer: Self-pay | Admitting: Internal Medicine

## 2018-12-19 ENCOUNTER — Ambulatory Visit: Payer: Self-pay | Admitting: Gerontology

## 2018-12-21 ENCOUNTER — Ambulatory Visit
Admission: RE | Admit: 2018-12-21 | Discharge: 2018-12-21 | Disposition: A | Discharge status: Home / Self Care | Payer: Self-pay | Source: Ambulatory Visit | Attending: Specialist | Admitting: Specialist

## 2018-12-21 DIAGNOSIS — M25461 Effusion, right knee: Secondary | ICD-10-CM | POA: Insufficient documentation

## 2018-12-21 DIAGNOSIS — M25561 Pain in right knee: Secondary | ICD-10-CM | POA: Insufficient documentation

## 2018-12-31 ENCOUNTER — Ambulatory Visit: Payer: Self-pay | Admitting: Specialist

## 2018-12-31 ENCOUNTER — Ambulatory Visit: Payer: Self-pay

## 2018-12-31 DIAGNOSIS — M25561 Pain in right knee: Secondary | ICD-10-CM

## 2018-12-31 DIAGNOSIS — G8929 Other chronic pain: Secondary | ICD-10-CM

## 2018-12-31 NOTE — Progress Notes (Signed)
  Subjective:     Patient ID: Monica Moody, female   DOB: January 27, 1966, 53 y.o.   MRN: [id number]  MRI shows a bucket handle tear LAT. meniscus. We will refer her to an orthopaedic surgeon. I have reviewed the images with her.  Review of Systems     Objective:   Physical Exam     Assessment:       Plan:    Ref to orthopaedics.

## 2018-12-31 NOTE — Progress Notes (Signed)
Entered in error

## 2019-01-01 ENCOUNTER — Other Ambulatory Visit: Payer: Self-pay

## 2019-01-01 DIAGNOSIS — G8929 Other chronic pain: Secondary | ICD-10-CM

## 2019-01-01 DIAGNOSIS — M25561 Pain in right knee: Principal | ICD-10-CM

## 2019-01-02 ENCOUNTER — Ambulatory Visit: Payer: Self-pay | Admitting: Gerontology

## 2019-01-09 ENCOUNTER — Other Ambulatory Visit: Payer: Self-pay

## 2019-01-09 ENCOUNTER — Ambulatory Visit: Payer: Self-pay | Admitting: Adult Health

## 2019-01-09 ENCOUNTER — Encounter: Payer: Self-pay | Admitting: Adult Health

## 2019-01-09 VITALS — BP 112/74 | HR 82 | Temp 98.0°F | Ht 63.0 in | Wt 167.5 lb

## 2019-01-09 DIAGNOSIS — N939 Abnormal uterine and vaginal bleeding, unspecified: Secondary | ICD-10-CM

## 2019-01-09 DIAGNOSIS — E785 Hyperlipidemia, unspecified: Secondary | ICD-10-CM

## 2019-01-09 DIAGNOSIS — Z720 Tobacco use: Secondary | ICD-10-CM

## 2019-01-09 DIAGNOSIS — I10 Essential (primary) hypertension: Secondary | ICD-10-CM

## 2019-01-09 NOTE — Progress Notes (Signed)
Patient: Monica Moody Female    DOB: August 31, 1966   53 y.o.   MRN: [id number] Visit Date: 01/09/2019  Today's Provider: Deforest Hoyles, NP   Chief Complaint  Patient presents with  . Sinus Problem    C/O nasal drainage dripping into throat. Afebrile, denies ear pain.    Subjective:    HPI 53 Y/O female presenting with nasal x couple of weeks. No fever, no N/V/D. No recent travel. She has seasonal allergies and takes zyrtec and tylenol as needed which seem to help with symptoms. Her last lipid panel was abnormal. She has been exercising and watching her diet. No other complaints offered. She is taking her lipid, depression, GERD and BP meds as ordered. No medication side effects reported. She continues to smoke. Uterine bleeding has subsided. She has not seen an OB/GYN.     Allergies  Allergen Reactions  . Bupropion Other (See Comments)    Hands peel  . Celexa [Citalopram Hydrobromide] Other (See Comments)    Affects heart  . Penicillins Swelling    Has patient had a PCN reaction causing immediate rash, facial/tongue/throat swelling, SOB or lightheadedness with hypotension: Yes Has patient had a PCN reaction causing severe rash involving mucus membranes or skin necrosis: No Has patient had a PCN reaction that required hospitalization: No Has patient had a PCN reaction occurring within the last 10 years: Unknown If all of the above answers are "NO", then may proceed with Cephalosporin use.    Previous Medications   BENZONATATE (TESSALON PERLES) 100 MG CAPSULE    Take 1 capsule (100 mg total) by mouth 3 (three) times daily.   BIOTIN 26333 MCG TABS    Take 2,000 mcg by mouth daily.   CHOLECALCIFEROL (VITAMIN D) 1000 UNITS TABLET    Take 1,000 Units by mouth daily.    DICLOFENAC SODIUM (VOLTAREN) 1 % GEL    Apply 2 g topically 4 (four) times daily.   DULOXETINE (CYMBALTA) 60 MG CAPSULE    Take 1 capsule (60 mg total) by mouth daily.   FENOFIBRATE (TRICOR) 145 MG TABLET    Take 1  tablet (145 mg total) by mouth daily.   HYDROCHLOROTHIAZIDE (HYDRODIURIL) 25 MG TABLET    Take 1 tablet (25 mg total) by mouth daily.   IBUPROFEN (ADVIL,MOTRIN) 800 MG TABLET    Take 1 tablet (800 mg total) by mouth every 8 (eight) hours as needed.   LAMOTRIGINE (LAMICTAL) 150 MG TABLET    Take 1 tablet (150 mg total) by mouth daily.   LANSOPRAZOLE (PREVACID) 30 MG CAPSULE    Take 1 capsule (30 mg total) by mouth daily before breakfast.   LEVOFLOXACIN (LEVAQUIN) 750 MG TABLET    Take 1 tablet (750 mg total) by mouth daily.   METOPROLOL TARTRATE (LOPRESSOR) 50 MG TABLET    Take 1 tablet (50 mg total) by mouth 2 (two) times daily.   PREDNISONE (STERAPRED UNI-PAK 21 TAB) 10 MG (21) TBPK TABLET    Use as directed   VITAMIN E 400 UNIT CAPSULE    Take 400 Units by mouth daily.     Review of Systems  Constitutional: Negative.   HENT: Positive for congestion, rhinorrhea and sinus pressure. Negative for sore throat.   Respiratory: Negative.   Cardiovascular: Negative.   Gastrointestinal: Negative.   Musculoskeletal: Negative.   Allergic/Immunologic: Positive for environmental allergies.  Neurological: Negative.     Social History   Tobacco Use  . Smoking status: Current Every Day Smoker  Packs/day: 0.50    Years: 30.00    Pack years: 15.00    Types: Cigarettes  . Smokeless tobacco: Never Used  Substance Use Topics  . Alcohol use: Yes    Comment: once every few months   Objective:   BP 112/74 (BP Location: Left Arm, Patient Position: Sitting)   Pulse 82   Temp 98 F (36.7 C) (Oral)   Ht '5\' 3"'  (1.6 m)   Wt 167 lb 8 oz (76 kg)   SpO2 98%   BMI 29.67 kg/m   Physical Exam Vitals signs and nursing note reviewed.  Constitutional:      Appearance: Normal appearance.  HENT:     Nose: Congestion and rhinorrhea (clear nasal drainage) present.     Mouth/Throat:     Mouth: Mucous membranes are moist.  Eyes:     Pupils: Pupils are equal, round, and reactive to light.   Cardiovascular:     Rate and Rhythm: Normal rate and regular rhythm.     Pulses: Normal pulses.     Heart sounds: Normal heart sounds.  Pulmonary:     Effort: Pulmonary effort is normal.     Breath sounds: Normal breath sounds.  Abdominal:     General: Bowel sounds are normal.     Palpations: Abdomen is soft.  Musculoskeletal: Normal range of motion.  Skin:    General: Skin is warm.     Capillary Refill: Capillary refill takes less than 2 seconds.  Neurological:     Mental Status: She is alert.    Assessment & Plan:  1. Hyperlipidemia, unspecified hyperlipidemia type Continue fenofibrate. Will obtain repeat labs - Comp Met (CMET) - Lipid Profile - HgB A1c  2. Abnormal uterine bleeding Resolved. Will check CBC   3. Tobacco use Smoking cessation advice given. Declined referral  4. Essential hypertension Well controlled. Continue current medications  Deforest Hoyles, NP   [facility]

## 2019-01-10 LAB — CBC WITH DIFFERENTIAL/PLATELET
Basophils Absolute: 0 10*3/uL (ref 0.0–0.2)
Basos: 1 %
EOS (ABSOLUTE): 0.2 10*3/uL (ref 0.0–0.4)
Eos: 3 %
Hematocrit: 44.2 % (ref 34.0–46.6)
Hemoglobin: 14.6 g/dL (ref 11.1–15.9)
Immature Grans (Abs): 0 10*3/uL (ref 0.0–0.1)
Immature Granulocytes: 1 %
Lymphocytes Absolute: 1.6 10*3/uL (ref 0.7–3.1)
Lymphs: 23 %
MCH: 28.7 pg (ref 26.6–33.0)
MCHC: 33 g/dL (ref 31.5–35.7)
MCV: 87 fL (ref 79–97)
Monocytes Absolute: 0.4 10*3/uL (ref 0.1–0.9)
Monocytes: 6 %
NEUTROS ABS: 4.4 10*3/uL (ref 1.4–7.0)
Neutrophils: 66 %
Platelets: 285 10*3/uL (ref 150–450)
RBC: 5.09 x10E6/uL (ref 3.77–5.28)
RDW: 13 % (ref 11.7–15.4)
WBC: 6.6 10*3/uL (ref 3.4–10.8)

## 2019-01-10 LAB — COMPREHENSIVE METABOLIC PANEL
A/G RATIO: 2 (ref 1.2–2.2)
ALT: 10 IU/L (ref 0–32)
AST: 9 IU/L (ref 0–40)
Albumin: 4.3 g/dL (ref 3.8–4.9)
Alkaline Phosphatase: 30 IU/L — ABNORMAL LOW (ref 39–117)
BUN/Creatinine Ratio: 18 (ref 9–23)
BUN: 15 mg/dL (ref 6–24)
Bilirubin Total: 0.3 mg/dL (ref 0.0–1.2)
CO2: 23 mmol/L (ref 20–29)
Calcium: 9.2 mg/dL (ref 8.7–10.2)
Chloride: 99 mmol/L (ref 96–106)
Creatinine, Ser: 0.84 mg/dL (ref 0.57–1.00)
GFR calc Af Amer: 93 mL/min/{1.73_m2} (ref >59)
GFR calc non Af Amer: 81 mL/min/{1.73_m2} (ref >59)
Globulin, Total: 2.1 g/dL (ref 1.5–4.5)
Glucose: 87 mg/dL (ref 65–99)
Potassium: 3.8 mmol/L (ref 3.5–5.2)
Sodium: 137 mmol/L (ref 134–144)
Total Protein: 6.4 g/dL (ref 6.0–8.5)

## 2019-01-10 LAB — HEMOGLOBIN A1C
Est. average glucose Bld gHb Est-mCnc: 105 mg/dL
Hgb A1c MFr Bld: 5.3 % (ref 4.8–5.6)

## 2019-01-10 LAB — LIPID PANEL
Chol/HDL Ratio: 5.5 ratio — ABNORMAL HIGH (ref 0.0–4.4)
Cholesterol, Total: 181 mg/dL (ref 100–199)
HDL: 33 mg/dL — ABNORMAL LOW (ref >39)
LDL Calculated: 107 mg/dL — ABNORMAL HIGH (ref 0–99)
Triglycerides: 207 mg/dL — ABNORMAL HIGH (ref 0–149)
VLDL Cholesterol Cal: 41 mg/dL — ABNORMAL HIGH (ref 5–40)

## 2019-03-11 ENCOUNTER — Other Ambulatory Visit: Payer: Self-pay | Admitting: Gerontology

## 2019-04-08 ENCOUNTER — Other Ambulatory Visit: Payer: Self-pay | Admitting: Internal Medicine

## 2019-04-08 DIAGNOSIS — I10 Essential (primary) hypertension: Secondary | ICD-10-CM

## 2019-04-10 ENCOUNTER — Ambulatory Visit: Payer: Self-pay | Admitting: Adult Health

## 2019-04-22 ENCOUNTER — Other Ambulatory Visit: Payer: Self-pay | Admitting: Gerontology

## 2019-04-22 DIAGNOSIS — N390 Urinary tract infection, site not specified: Secondary | ICD-10-CM

## 2019-04-23 ENCOUNTER — Other Ambulatory Visit: Payer: Self-pay

## 2019-04-23 DIAGNOSIS — N390 Urinary tract infection, site not specified: Secondary | ICD-10-CM

## 2019-04-25 ENCOUNTER — Other Ambulatory Visit: Payer: Self-pay | Admitting: Gerontology

## 2019-04-26 LAB — MICROSCOPIC EXAMINATION
Casts: NONE SEEN /lpf
Epithelial Cells (non renal): >10 /hpf — AB (ref 0–10)

## 2019-04-26 LAB — UA/M W/RFLX CULTURE, ROUTINE
Bilirubin, UA: NEGATIVE
Glucose, UA: NEGATIVE
Ketones, UA: NEGATIVE
Nitrite, UA: NEGATIVE
Protein,UA: NEGATIVE
RBC, UA: NEGATIVE
Specific Gravity, UA: 1.011 (ref 1.005–1.030)
Urobilinogen, Ur: 0.2 mg/dL (ref 0.2–1.0)
pH, UA: 7 (ref 5.0–7.5)

## 2019-04-26 LAB — URINE CULTURE, REFLEX

## 2019-05-01 ENCOUNTER — Other Ambulatory Visit: Payer: Self-pay

## 2019-05-01 ENCOUNTER — Encounter: Payer: Self-pay | Admitting: Adult Health

## 2019-05-01 NOTE — Progress Notes (Signed)
This encounter was created in error - please disregard.

## 2019-05-30 ENCOUNTER — Other Ambulatory Visit: Payer: Self-pay | Admitting: Internal Medicine

## 2019-05-30 DIAGNOSIS — Z20822 Contact with and (suspected) exposure to covid-19: Secondary | ICD-10-CM

## 2019-06-01 LAB — NOVEL CORONAVIRUS, NAA: SARS-CoV-2, NAA: NOT DETECTED

## 2019-07-14 ENCOUNTER — Telehealth: Payer: Self-pay | Admitting: Pharmacy Technician

## 2019-07-14 ENCOUNTER — Other Ambulatory Visit: Payer: Self-pay | Admitting: Gerontology

## 2019-07-14 DIAGNOSIS — I10 Essential (primary) hypertension: Secondary | ICD-10-CM

## 2019-07-14 NOTE — Telephone Encounter (Signed)
Received 2020 proof of income.  Patient eligible to receive medication assistance at [facility] as long as eligibility requirements continue to be met.  [facility]

## 2019-07-25 ENCOUNTER — Other Ambulatory Visit: Payer: Self-pay

## 2019-07-25 DIAGNOSIS — I10 Essential (primary) hypertension: Secondary | ICD-10-CM

## 2019-07-25 MED ORDER — METOPROLOL TARTRATE 50 MG PO TABS: 50.0000 mg | ORAL_TABLET | Freq: Two times a day (BID) | ORAL | 0 refills | Status: DC

## 2019-07-25 MED ORDER — HYDROCHLOROTHIAZIDE 25 MG PO TABS: 25.0000 mg | ORAL_TABLET | Freq: Every day | ORAL | 0 refills | Status: DC

## 2019-07-31 ENCOUNTER — Ambulatory Visit: Payer: Self-pay | Admitting: Family Medicine

## 2019-07-31 ENCOUNTER — Other Ambulatory Visit: Payer: Self-pay

## 2019-07-31 VITALS — BP 127/83 | HR 75 | Temp 97.7°F | Ht 63.0 in | Wt 176.5 lb

## 2019-07-31 DIAGNOSIS — R5383 Other fatigue: Secondary | ICD-10-CM

## 2019-07-31 DIAGNOSIS — M791 Myalgia, unspecified site: Secondary | ICD-10-CM

## 2019-07-31 DIAGNOSIS — F330 Major depressive disorder, recurrent, mild: Secondary | ICD-10-CM

## 2019-07-31 DIAGNOSIS — E559 Vitamin D deficiency, unspecified: Secondary | ICD-10-CM
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
## 2019-08-05 ENCOUNTER — Institutional Professional Consult (permissible substitution): Payer: Self-pay | Admitting: Licensed Clinical Social Worker

## 2019-08-07 ENCOUNTER — Other Ambulatory Visit: Payer: Self-pay

## 2019-08-07 DIAGNOSIS — M791 Myalgia, unspecified site: Secondary | ICD-10-CM

## 2019-08-07 DIAGNOSIS — R5383 Other fatigue: Secondary | ICD-10-CM

## 2019-08-12 LAB — CBC
Hematocrit: 40.3 % (ref 34.0–46.6)
Hemoglobin: 14.3 g/dL (ref 11.1–15.9)
MCH: 29.9 pg (ref 26.6–33.0)
MCHC: 35.5 g/dL (ref 31.5–35.7)
MCV: 84 fL (ref 79–97)
Platelets: 293 10*3/uL (ref 150–450)
RBC: 4.78 x10E6/uL (ref 3.77–5.28)
RDW: 13 % (ref 11.7–15.4)
WBC: 8.6 10*3/uL (ref 3.4–10.8)

## 2019-08-12 LAB — THYROID PANEL WITH TSH
Free Thyroxine Index: 1.4 (ref 1.2–4.9)
T3 Uptake Ratio: 20 % — ABNORMAL LOW (ref 24–39)
T4, Total: 6.9 ug/dL (ref 4.5–12.0)
TSH: 1.61 u[IU]/mL (ref 0.450–4.500)

## 2019-08-12 LAB — ANA,IFA RA DIAG PNL W/RFLX TIT/PATN
ANA Titer 1: NEGATIVE
Cyclic Citrullin Peptide Ab: 5 units (ref 0–19)
Rheumatoid fact SerPl-aCnc: <10 IU/mL (ref 0.0–13.9)

## 2019-08-12 LAB — VITAMIN B1: Thiamine: 238.5 nmol/L — ABNORMAL HIGH (ref 66.5–200.0)

## 2019-08-12 LAB — VITAMIN D 25 HYDROXY (VIT D DEFICIENCY, FRACTURES): Vit D, 25-Hydroxy: 34.9 ng/mL (ref 30.0–100.0)

## 2019-08-14 ENCOUNTER — Other Ambulatory Visit: Payer: Self-pay

## 2019-08-14 ENCOUNTER — Ambulatory Visit: Payer: Self-pay | Admitting: Urology

## 2019-08-14 VITALS — BP 115/81 | HR 76 | Ht 63.0 in | Wt 175.2 lb

## 2019-08-14 DIAGNOSIS — E785 Hyperlipidemia, unspecified: Secondary | ICD-10-CM

## 2019-08-14 DIAGNOSIS — Z23 Encounter for immunization: Secondary | ICD-10-CM

## 2019-08-14 DIAGNOSIS — I10 Essential (primary) hypertension: Secondary | ICD-10-CM

## 2019-08-14 DIAGNOSIS — R5383 Other fatigue: Secondary | ICD-10-CM

## 2019-08-14 MED ORDER — LANSOPRAZOLE 30 MG PO CPDR: DELAYED_RELEASE_CAPSULE | ORAL | 3 refills | Status: DC

## 2019-08-14 MED ORDER — HYDROCHLOROTHIAZIDE 25 MG PO TABS: 25.0000 mg | ORAL_TABLET | Freq: Every day | ORAL | 3 refills | Status: DC

## 2019-08-14 MED ORDER — METOPROLOL TARTRATE 50 MG PO TABS: 50.0000 mg | ORAL_TABLET | Freq: Two times a day (BID) | ORAL | 3 refills | Status: DC

## 2019-08-14 MED ORDER — FENOFIBRATE 145 MG PO TABS: 145.0000 mg | ORAL_TABLET | Freq: Every day | ORAL | 3 refills | Status: DC

## 2019-08-14 NOTE — Addendum Note (Signed)
Addended by: Adah Perl on: 08/14/2019 07:46 PM   Modules accepted: Orders

## 2019-08-14 NOTE — Progress Notes (Signed)
Patient: Monica Moody Female    DOB: 13-Sep-1966   53 y.o.   MRN: [id number] Visit Date: 08/14/2019  Today's Provider: Zara Council, PA-C   Chief Complaint  Patient presents with  . Follow-up    follow-up on blood work   Subjective:    HPI  Blood work essentially normal.  She is only having tele visits with a therapist, she has not seen a doctor.    Under a lot of stress - Monica Moody will call her on Tuesday   She is still having body aches/fatigue x 3 months   Allergies  Allergen Reactions  . Bupropion Other (See Comments)    Hands peel  . Celexa [Citalopram Hydrobromide] Other (See Comments)    Affects heart  . Penicillins Swelling    Has patient had a PCN reaction causing immediate rash, facial/tongue/throat swelling, SOB or lightheadedness with hypotension: Yes Has patient had a PCN reaction causing severe rash involving mucus membranes or skin necrosis: No Has patient had a PCN reaction that required hospitalization: No Has patient had a PCN reaction occurring within the last 10 years: Unknown If all of the above answers are "NO", then may proceed with Cephalosporin use.    Previous Medications   BIOTIN 47654 MCG TABS    Take 2,000 mcg by mouth daily.   CHOLECALCIFEROL (VITAMIN D) 1000 UNITS TABLET    Take 1,000 Units by mouth daily.    DICLOFENAC SODIUM (VOLTAREN) 1 % GEL    Apply 2 g topically 4 (four) times daily.   DULOXETINE (CYMBALTA) 60 MG CAPSULE    Take 1 capsule (60 mg total) by mouth daily.   FENOFIBRATE (TRICOR) 145 MG TABLET    Take 1 tablet (145 mg total) by mouth daily.   HYDROCHLOROTHIAZIDE (HYDRODIURIL) 25 MG TABLET    Take 1 tablet (25 mg total) by mouth daily.   IBUPROFEN (ADVIL,MOTRIN) 800 MG TABLET    Take 1 tablet (800 mg total) by mouth every 8 (eight) hours as needed.   LAMOTRIGINE (LAMICTAL) 150 MG TABLET    Take 1 tablet (150 mg total) by mouth daily.   LANSOPRAZOLE (PREVACID) 30 MG CAPSULE    TAKE ONE CAPSULE BY MOUTH EVERY DAY BEFORE  BREAKFAST   METOPROLOL TARTRATE (LOPRESSOR) 50 MG TABLET    Take 1 tablet (50 mg total) by mouth 2 (two) times daily.   MULTIPLE VITAMIN (MULTIVITAMIN) TABLET    Take 1 tablet by mouth daily.   VITAMIN E 400 UNIT CAPSULE    Take 400 Units by mouth daily.     Review of Systems  Social History   Tobacco Use  . Smoking status: Current Every Day Smoker    Packs/day: 0.50    Years: 35.00    Pack years: 17.50    Types: Cigarettes  . Smokeless tobacco: Never Used  Substance Use Topics  . Alcohol use: Not Currently   Objective:   BP 115/81   Pulse 76   Ht 5\' 3"  (1.6 m) Comment: self-reported  Wt 175 lb 3.2 oz (79.5 kg)   SpO2 96%   BMI 31.04 kg/m   Physical Exam      Assessment & Plan:      1. Fatigue/body aches Likely due to stress Will speak with Monica Moody on Tuesday Will try to refer to rheumatology  - filling out [facility] care form  2. HTN At goal Continue HCTZ and metoprolol   3. Vitamin D deficiency With start taking 2000 IU daily  Monica Cowboy, PA-C   [facility]

## 2019-08-19 ENCOUNTER — Ambulatory Visit: Payer: Self-pay | Admitting: Licensed Clinical Social Worker

## 2019-08-19 ENCOUNTER — Other Ambulatory Visit: Payer: Self-pay

## 2019-08-19 DIAGNOSIS — F331 Major depressive disorder, recurrent, moderate: Secondary | ICD-10-CM

## 2019-08-19 DIAGNOSIS — F411 Generalized anxiety disorder: Secondary | ICD-10-CM
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
## 2019-08-31 ENCOUNTER — Telehealth: Payer: Self-pay | Admitting: Physician Assistant

## 2019-08-31 DIAGNOSIS — N390 Urinary tract infection, site not specified: Secondary | ICD-10-CM

## 2019-08-31 MED ORDER — SULFAMETHOXAZOLE-TRIMETHOPRIM 800-160 MG PO TABS: 1.0000 | ORAL_TABLET | Freq: Two times a day (BID) | ORAL | 0 refills | Status: DC

## 2019-08-31 NOTE — Progress Notes (Signed)
We are sorry that you are not feeling well.  Here is how we plan to help!  Based on what you shared with me it looks like you most likely have a simple urinary tract infection.  A UTI (Urinary Tract Infection) is a bacterial infection of the bladder.  Most cases of urinary tract infections are simple to treat but a key part of your care is to encourage you to drink plenty of fluids and watch your symptoms carefully.  I have prescribed Bactrim DS 1 tab twice daily for 7 days. Your symptoms should gradually improve. Call us if the burning in your urine worsens, you develop worsening fever, back pain or pelvic pain or if your symptoms do not resolve after completing the antibiotic.  Urinary tract infections can be prevented by drinking plenty of water to keep your body hydrated.  Also be sure when you wipe, wipe from front to back and don't hold it in!  If possible, empty your bladder every 4 hours.  Your e-visit answers were reviewed by a board certified advanced clinical practitioner to complete your personal care plan.  Depending on the condition, your plan could have included both over the counter or prescription medications.  If there is a problem please reply  once you have received a response from your provider.  Your safety is important to [place].  If you have drug allergies check your prescription carefully.    You can use MyChart to ask questions about today's visit, request a non-urgent call back, or ask for a work or school excuse for 24 hours related to this e-Visit. If it has been greater than 24 hours you will need to follow up with your provider, or enter a new e-Visit to address those concerns.   You will get an e-mail in the next two days asking about your experience.  I hope that your e-visit has been valuable and will speed your recovery. Thank you for using e-visits.  Greater than 5 minutes, yet less than 10 minutes of time have been spent researching, coordinating, and  implementing care for this patient today

## 2019-08-31 NOTE — Addendum Note (Signed)
Addended by: Penni Bombard, Lily Kernen TODD on: 08/31/2019 12:28 PM   Modules accepted: Orders

## 2019-09-02 ENCOUNTER — Ambulatory Visit: Payer: Self-pay | Admitting: Licensed Clinical Social Worker

## 2019-09-02 ENCOUNTER — Other Ambulatory Visit: Payer: Self-pay

## 2019-09-02 DIAGNOSIS — F331 Major depressive disorder, recurrent, moderate: Secondary | ICD-10-CM

## 2019-09-02 DIAGNOSIS — F411 Generalized anxiety disorder: Secondary | ICD-10-CM

## 2019-09-02 NOTE — BH Specialist Note (Signed)
Integrated Behavioral Health Follow Up Visit Via Phone  MRN: [id number] Name: Monica Moody  Number of [place] Clinician visits: 1/6  Type of Service: [place] Interpretor:No. Interpretor Name and Language: not applicable.   SUBJECTIVE: Monica Moody is a 53 y.o. female accompanied by herself. Patient was referred by Carlyon Shadow NP for mental health.   Patient reports the following symptoms/concerns: She reports that her anxiety was real bad last week and wants to know if something can be added to help. She explained that her mom takes Buspar and she thought it might help with her anxiety as well. She notes that she would like to be on something to keep at [place]. She explains that she feels like her anxiety controls her and she cannot control it. She explains that her thoughts are constant. She explains that her anxiety is still bad regardless if she is around her husband or son. She explains that her last therapist told her that she had boundary and codependency issues. She notes that she joined a group on facebook for support for people who are in her situation or have been in her situation. She explains that she mainly just reads what other people have to say. She denies suicidal and homicidal thoughts.  Duration of problem: ; Severity of problem: moderate  OBJECTIVE: Mood: Euthymic and Affect: Appropriate Risk of harm to self or others: No plan to harm self or others  LIFE CONTEXT: Family and Social: see above. School/Work: see above. Self-Care: see above. Life Changes: see above.   GOALS ADDRESSED: Patient will: 1.  Reduce symptoms of: anxiety  2.  Increase knowledge and/or ability of: coping skills and stress reduction  3.  Demonstrate ability to: Increase healthy adjustment to current life circumstances  INTERVENTIONS: Interventions utilized:  Brief CBT was utilized by the clinician focusing on the patient's anxiety  and affect on normal cognition. Clinician processed with the patient regarding how she has been doing since the last follow up session. Clinician explained to the patient that she can speak with Dr. Octavia Heir, MD, psychiatric consultant on Tuesday November 10th @ 9 am to discuss the option of adding a medication for her increased anxiety. Clinician discussed with the patient regarding what her triggers are in terms of her anxiety and what is the source of the increase in the last week. Clinician provided the patient a resource to family abuse services regarding their weekly support group and contact information. Clinician discussed with the client regarding what kind of coping skills has she tried in the past.  Standardized Assessments completed: GAD-7 and PHQ 9  ASSESSMENT: Patient currently experiencing see above.  Patient may benefit from see above.  PLAN: 1. Follow up with behavioral health clinician on : two weeks or earlier if needed.  2. Behavioral recommendations: Case consultation with Dr. Octavia Heir, MD, psychiatric consultant on Tuesday November 10th @ 9 am to discuss adding a medication for her anxiety.  3. Referral(s): [facility] (In Clinic) 4. "From scale of 1-10, how likely are you to follow plan?":   Bayard Hugger, LCSW

## 2019-09-10 ENCOUNTER — Other Ambulatory Visit: Payer: Self-pay

## 2019-09-10 MED ORDER — LAMOTRIGINE 200 MG PO TABS: 200.0000 mg | ORAL_TABLET | Freq: Every day | ORAL | 1 refills | Status: DC

## 2019-09-10 MED ORDER — DULOXETINE HCL 60 MG PO CPEP: 60.0000 mg | ORAL_CAPSULE | Freq: Every day | ORAL | 1 refills | Status: DC

## 2019-09-16 ENCOUNTER — Ambulatory Visit: Payer: Self-pay | Admitting: Licensed Clinical Social Worker

## 2019-09-16 ENCOUNTER — Other Ambulatory Visit: Payer: Self-pay

## 2019-09-16 ENCOUNTER — Telehealth: Payer: Self-pay | Admitting: Emergency Medicine

## 2019-09-16 ENCOUNTER — Ambulatory Visit
Admission: EM | Admit: 2019-09-16 | Discharge: 2019-09-16 | Disposition: A | Discharge status: Home / Self Care | Payer: Self-pay | Attending: Family Medicine | Admitting: Family Medicine

## 2019-09-16 DIAGNOSIS — F331 Major depressive disorder, recurrent, moderate: Secondary | ICD-10-CM

## 2019-09-16 DIAGNOSIS — B373 Candidiasis of vulva and vagina: Secondary | ICD-10-CM

## 2019-09-16 DIAGNOSIS — R1030 Lower abdominal pain, unspecified: Secondary | ICD-10-CM

## 2019-09-16 DIAGNOSIS — F411 Generalized anxiety disorder: Secondary | ICD-10-CM

## 2019-09-16 DIAGNOSIS — R350 Frequency of micturition: Secondary | ICD-10-CM

## 2019-09-16 DIAGNOSIS — R300 Dysuria: Secondary | ICD-10-CM

## 2019-09-16 DIAGNOSIS — B3731 Acute candidiasis of vulva and vagina: Secondary | ICD-10-CM

## 2019-09-16 LAB — URINALYSIS, COMPLETE (UACMP) WITH MICROSCOPIC
Bacteria, UA: NONE SEEN
Bilirubin Urine: NEGATIVE
Glucose, UA: NEGATIVE mg/dL
Hgb urine dipstick: NEGATIVE
Ketones, ur: NEGATIVE mg/dL
Leukocytes,Ua: NEGATIVE
Nitrite: NEGATIVE
Protein, ur: NEGATIVE mg/dL
Specific Gravity, Urine: 1.025 (ref 1.005–1.030)
pH: 6 (ref 5.0–8.0)

## 2019-09-16 MED ORDER — FLUCONAZOLE 150 MG PO TABS: 150.0000 mg | ORAL_TABLET | Freq: Every day | ORAL | 0 refills | Status: DC

## 2019-09-16 NOTE — Progress Notes (Signed)
Based on what you shared with me, I feel your condition warrants further evaluation and I recommend that you be seen for a face to face office visit.  Repeat urinary tract infections within a few weeks of each other can be caused by an inadequately treated infection or by an infection that is resistant to our antibiotics.  As such, it is our standard of practice to refer for an in-person visit in a situation like this.  Unfortunately, I have no means of performing the proper tests that you need to treat you appropriately.    You should contact your doctor or go to an urgent care or ER as listed below.  You will need to have at least a formal urinalysis and a urine culture performed and may need additional testing as deemed appropriate.Marland Kitchen    NOTE: If you entered your credit card information for this eVisit, you will not be charged. You may see a "hold" on your card for the $35 but that hold will drop off and you will not have a charge processed.   If you are having a true medical emergency please call 911.      For an urgent face to face visit, [facility] has five urgent care centers for your convenience:      NEW:  [facility] [place] at [place] Get Driving Directions [phone] [place], [place] [address] . 10 am - 6pm Monday - Friday    [facility]) Get Driving Directions [phone] [address] [place], [place] [address] . 10 am to 8 pm Monday-Friday . 12 pm to 8 pm [facility] at [facility] Get Driving Directions [phone] [place], [place], [place] [address] . 8 am to 8 pm Monday-Friday . 9 am to 6 pm Saturday . 11 am to 6 pm Sunday     [facility] at [facility] Get Driving Directions  [phone] [address] [place], [place] [address] . 8 am to 8 pm Monday-Friday . 8 am to 4 pm [facility]gent Care at  Get Driving Directions [phone] [address] [place], [place] [address] . 12 pm to 6 pm Monday-Friday      Your e-visit answers were reviewed by a board certified advanced clinical practitioner to complete your personal care plan.  Thank you for using e-Visits.  Greater than 5 minutes, yet less than 10 minutes was used in reviewing the patient's chart, questionnaire, prescribing medications, and documentation for this visit.

## 2019-09-16 NOTE — ED Triage Notes (Signed)
Pt. States she had a UTI 3 weeks ago and last night she states when she wipes it hurts, pelvic pain and burns as well.

## 2019-09-16 NOTE — Discharge Instructions (Addendum)
Your urine does not show an infection in the bladder, looks more like you have an yeast infection that may be causing the burning in your vaginal area, but this does not cause bladder pain.  We will have you do a pelvic ultrasound tomorrow am, and we will inform you of the results when is back.

## 2019-09-16 NOTE — ED Provider Notes (Addendum)
[facility]    CSN: [id number] Arrival date & time: 09/16/19  1453      History   Chief Complaint Chief Complaint  Patient presents with  . Recurrent UTI    HPI Monica Moody is a 53 y.o. female. who presents with dysuria and also burning on urethra when she wipes, but denies skin lesions or labia pain. And also has suprapubic pain. Denies fever, chills, sweats, flank pain. Has had mild nausea.  Her last UTI was 3 weeks ago and finished all the antibiotic.  Is in a monogamous sexual relationship for many years, and denies abnormal vaginal discharge. Denies past hx of uterine fibroids. But had ovarian cyst in the past.    Past Medical History:  Diagnosis Date  . Abscess of chest wall   . Anxiety   . Depression   . GERD (gastroesophageal reflux disease)   . Hyperlipidemia   . Hypertension   . IFG (impaired fasting glucose)   . Kidney stone   . Obesity   . Palpitations   . Tobacco use     Patient Active Problem List   Diagnosis Date Noted  . Abnormal uterine bleeding 10/17/2018  . Acute exacerbation of chronic bronchitis (HCC) 09/05/2018  . Acute bacterial sinusitis 09/05/2018  . Acute cholecystitis due to biliary calculus 06/26/2018  . Segmental dysfunction of lumbar region 10/18/2016  . Muscle spasm of back 10/18/2016  . Cervical segment dysfunction 10/18/2016  . Abnormal posture 10/18/2016  . Hand pain, right 10/10/2016  . Low back pain 10/10/2016  . Mixed hyperlipidemia 08/22/2016  . BMI 40.0-44.9, adult (HCC) 07/06/2016  . Hypertriglyceridemia 08/31/2015  . Breast cancer screening 08/30/2015  . Vitamin D deficiency 08/30/2015  . Medication monitoring encounter 08/30/2015  . Needs flu shot 08/30/2015  . Essential hypertension   . GERD (gastroesophageal reflux disease)   . Depression   . Anxiety   . Tobacco use   . IFG (impaired fasting glucose)   . Obesity   . Palpitations     Past Surgical History:  Procedure Laterality Date  . BREAST  BIOPSY Left    surgical bx  . BREAST LUMPECTOMY Left   . CHOLECYSTECTOMY N/A 06/26/2018   Procedure: LAPAROSCOPIC CHOLECYSTECTOMY WITH INTRAOPERATIVE CHOLANGIOGRAM;  Surgeon: Ancil Linseyavis, Jason Evan, MD;  Location: [facility] ORS;  Service: General;  Laterality: N/A;  . mesh sling implated     due to urethra dropping  . OVARIAN CYST REMOVAL Right   . TUBAL LIGATION    . Uterine Ablation      OB History   No obstetric history on file.      Home Medications    Prior to Admission medications   Medication Sig Start Date End Date Taking? Authorizing Provider  Biotin [id number] MCG TABS Take 2,000 mcg by mouth daily.    [provider]  cholecalciferol (VITAMIN D) 1000 UNITS tablet Take 1,000 Units by mouth daily.     [provider]  diclofenac sodium (VOLTAREN) 1 % GEL Apply 2 g topically 4 (four) times daily. Patient not taking: Reported on 07/31/2019 09/19/18   Andreas Ohmukov-Yual, Magdalene S, NP  DULoxetine (CYMBALTA) 60 MG capsule Take 1 capsule (60 mg total) by mouth daily. 09/10/19 10/10/19  Tukov-Yual, Alroy BailiffMagdalene S, NP  fenofibrate (TRICOR) 145 MG tablet Take 1 tablet (145 mg total) by mouth daily. 08/14/19   Michiel CowboyMcGowan, Shannon A, PA-C  hydrochlorothiazide (HYDRODIURIL) 25 MG tablet Take 1 tablet (25 mg total) by mouth daily. 08/14/19   Michiel CowboyMcGowan, Shannon  A, PA-C  ibuprofen (ADVIL,MOTRIN) 800 MG tablet Take 1 tablet (800 mg total) by mouth every 8 (eight) hours as needed. 09/05/18   Tukov-Yual, Alroy Bailiff, NP  lamoTRIgine (LAMICTAL) 200 MG tablet Take 1 tablet (200 mg total) by mouth daily. 09/10/19 10/10/19  Tukov-Yual, Alroy Bailiff, NP  lansoprazole (PREVACID) 30 MG capsule TAKE ONE CAPSULE BY MOUTH EVERY DAY BEFORE BREAKFAST 08/14/19   McGowan, Carollee Herter A, PA-C  metoprolol tartrate (LOPRESSOR) 50 MG tablet Take 1 tablet (50 mg total) by mouth 2 (two) times daily. 08/14/19   [name] A, PA-C  Multiple Vitamin (MULTIVITAMIN) tablet Take 1 tablet by mouth daily.    [provider]  sulfamethoxazole-trimethoprim (BACTRIM DS) 800-160 MG tablet Take 1 tablet by mouth 2 (two) times daily. 08/31/19   Hedges, Tinnie Gens, PA-C  vitamin E 400 UNIT capsule Take 400 Units by mouth daily.     [provider]    Family History Family History  Problem Relation Age of Onset  . Arthritis Mother   . Diabetes Mother   . Heart disease Mother   . Hyperlipidemia Mother   . Hypertension Mother   . Migraines Mother   . Cancer Mother        breast  . Diabetes Father   . Hyperlipidemia Father   . Hypertension Father   . Cancer Father        kidney  . Migraines Brother   . Cancer Maternal Aunt        ovarian  . Cancer Paternal Uncle        intestines  . Cancer Maternal Grandmother        stomach  . COPD Maternal Grandfather   . Stroke Neg Hx     Social History Social History   Tobacco Use  . Smoking status: Current Every Day Smoker    Packs/day: 0.50    Years: 35.00    Pack years: 17.50    Types: Cigarettes  . Smokeless tobacco: Never Used  Substance Use Topics  . Alcohol use: Not Currently  . Drug use: No     Allergies   Bupropion, Celexa [citalopram hydrobromide], and Penicillins   Review of Systems Review of Systems  Constitutional: Negative for chills, diaphoresis and fever.  Gastrointestinal: Positive for abdominal pain, nausea and vomiting.  Genitourinary: Positive for dysuria, frequency, pelvic pain and urgency. Negative for difficulty urinating, flank pain, genital sores, vaginal bleeding, vaginal discharge and vaginal pain.  Musculoskeletal: Negative for back pain, gait problem and myalgias.  Skin: Negative for rash.     Physical Exam Triage Vital Signs ED Triage Vitals  Enc Vitals Group     BP 09/16/19 1512 98/71     Pulse Rate 09/16/19 1512 73     Resp 09/16/19 1512 18     Temp 09/16/19 1512 98.4 F (36.9 C)     Temp Source 09/16/19 1512 Oral     SpO2 09/16/19 1512 99 %     Weight 09/16/19 1508 173 lb (78.5 kg)     Height --       Head Circumference --      Peak Flow --      Pain Score 09/16/19 1508 4     Pain Loc --      Pain Edu? --      Excl. in GC? --    No data found.  Updated Vital Signs BP 98/71 (BP Location: Left Arm)   Pulse 73   Temp 98.4 F (36.9  C) (Oral)   Resp 18   Wt 173 lb (78.5 kg)   LMP 09/02/2019 (Exact Date)   SpO2 99%   BMI 30.65 kg/m   Visual Acuity Right Eye Distance:   Left Eye Distance:   Bilateral Distance:    Right Eye Near:   Left Eye Near:    Bilateral Near:     Physical Exam Vitals signs and nursing note reviewed.  Constitutional:      General: She is not in acute distress.    Appearance: She is obese. She is not toxic-appearing.  HENT:     Head: Atraumatic.     Right Ear: External ear normal.     Left Ear: External ear normal.  Eyes:     General: No scleral icterus.    Extraocular Movements: Extraocular movements intact.     Conjunctiva/sclera: Conjunctivae normal.  Neck:     Musculoskeletal: Neck supple.  Pulmonary:     Effort: Pulmonary effort is normal.  Abdominal:     General: Bowel sounds are normal.     Palpations: Abdomen is soft. There is no mass.     Tenderness: There is abdominal tenderness. There is no right CVA tenderness, left CVA tenderness, guarding or rebound.     Comments: Has mild tenderness on suprapubic region  Genitourinary:    General: Normal vulva.     Comments: Bimanual exam revealed uterine pain with motion, but negative chandelier. Uterus feels enlarged, no adnexal masses palpated. No speculum exam done.  Musculoskeletal: Normal range of motion.  Skin:    General: Skin is warm and dry.     Findings: No rash.  Neurological:     Mental Status: She is alert and oriented to person, place, and time.     Gait: Gait normal.  Psychiatric:        Mood and Affect: Mood normal.        Behavior: Behavior normal.        Thought Content: Thought content normal.        Judgment: Judgment normal.      UC Treatments / Results   Labs (all labs ordered are listed, but only abnormal results are displayed) Labs Reviewed  URINALYSIS, COMPLETE (UACMP) WITH MICROSCOPIC    EKG   Radiology No results found.  Procedures Procedures (including critical care time)  Medications Ordered in UC Medications - No data to display  Initial Impression / Assessment and Plan / UC Course  I have reviewed the triage vital signs and the nursing notes. Pertinent labs  results that were available during my care of the patient were reviewed by me and considered in my medical decision making (see chart for details). Her UA is negative for UTI.  A pelvic US ordered for tomorrow am.  I placed her on diflucan to cover yeast.  Final Clinical Impressions(s) / UC Diagnoses   Final diagnoses:  None   Discharge Instructions   None    ED Prescriptions    None     PDMP not reviewed this encounter.   Shelby Mattocks, PA-C 09/16/19 1633    Rodriguez-Southworth, [place], PA-C 09/16/19 1634

## 2019-09-16 NOTE — BH Specialist Note (Signed)
Integrated Behavioral Health Follow Up Visit Via the Phone  MRN: [id number] Name: Monica Moody  Number of [facility] Clinician visits: 2/6  Type of Service: [place] Interpretor:No. Interpretor Name and Language: Not applicable.   SUBJECTIVE: Monica Moody is a 53 y.o. female accompanied by herself. Patient was referred by  Patient reports the following symptoms/concerns: She explains that she read reviews online about Lamictal and it caused a lot of anxiety so she took it upon herself to break it in half to be 75 mg instead. She notes that her fear after reading the reviews that it would cause more anxiety than help her. She explains that it seems to be helping a little bit. She explains that any time something seems off, wondering what she did wrong and questions why she is not good enough to be treated the way she deserves to be treated. She explains that she has trouble sticking to boundaries with people. She notes that she is scared to put up boundaries because of the potential repercussions. She notes that she has to go to urgent care due to another urinary tract infections. She explains that she wants to learn to focus on herself and take care of her without feeling so anxious about it. She denies suicidal and homicidal thoughts.  Duration of problem:; Severity of problem: moderate  OBJECTIVE: Mood: Euthymic and Affect: Appropriate Risk of harm to self or others: No plan to harm self or others  LIFE CONTEXT: Family and Social: see above. School/Work: see above. Self-Care: see above. Life Changes: see above.  GOALS ADDRESSED: Patient will: 1.  Reduce symptoms of: anxiety  2.  Increase knowledge and/or ability of: stress reduction  3.  Demonstrate ability to: Increase healthy adjustment to current life circumstances  INTERVENTIONS: Interventions utilized:  Psychoeducation and/or Health Education was utilized by the clinician  during today's follow up session. Clinician processed with the patient regarding how she has been doing since the last follow up session. Clinician explained to the patient that just because one patient experienced more anxiety taking Lamictal does not mean that it will also happen with her. Clinician explained to the patient that in the future to please contact the clinic or leave a message with her to speak with the psychiatrist regarding any questions and concerns with her psychotropic medications. Clinician explained to the patient that it sounds like she could benefit from speaking with an advocate at [facility] or the [facility]. Clinician explained to the patient that she thinks it would be helpful for her to attend a weekly support group but with check with both agencies to see what is offered in the midst of the pandemic.  Standardized Assessments completed: GAD-7 and PHQ 9  ASSESSMENT: Patient currently experiencing see above.  Patient may benefit from see above.  PLAN: 1. Follow up with behavioral health clinician on : two weeks or earlier if needed.  2. Behavioral recommendations: see above. 3. Referral(s): [facility] (In Clinic) 4. "From scale of 1-10, how likely are you to follow plan?":   Bayard Hugger, LCSW

## 2019-09-17 ENCOUNTER — Ambulatory Visit
Admission: RE | Admit: 2019-09-17 | Discharge: 2019-09-17 | Disposition: A | Discharge status: Home / Self Care | Payer: Self-pay | Source: Ambulatory Visit | Attending: Internal Medicine | Admitting: Internal Medicine

## 2019-09-17 ENCOUNTER — Other Ambulatory Visit: Payer: Self-pay | Admitting: Internal Medicine

## 2019-09-17 DIAGNOSIS — N889 Noninflammatory disorder of cervix uteri, unspecified: Secondary | ICD-10-CM | POA: Insufficient documentation

## 2019-09-17 DIAGNOSIS — R1030 Lower abdominal pain, unspecified: Secondary | ICD-10-CM | POA: Insufficient documentation

## 2019-09-17 DIAGNOSIS — R102 Pelvic and perineal pain: Secondary | ICD-10-CM

## 2019-09-17 LAB — URINE CULTURE
Culture: <10000 — AB
Special Requests: NORMAL

## 2019-09-18 ENCOUNTER — Telehealth: Payer: Self-pay | Admitting: Emergency Medicine

## 2019-09-18 ENCOUNTER — Ambulatory Visit: Payer: Self-pay

## 2019-09-18 NOTE — Telephone Encounter (Signed)
Patient called for results of ultrasound. Spoke with Dr. Lacinda Axon and advised that ultrasound showed cyst o/w negative. Patient was advised to f/u with GYN for continued pain. Patient agreed and voiced understanding.

## 2019-10-02 ENCOUNTER — Ambulatory Visit: Payer: Self-pay | Admitting: Licensed Clinical Social Worker

## 2019-10-02 ENCOUNTER — Other Ambulatory Visit: Payer: Self-pay | Admitting: Obstetrics and Gynecology

## 2019-10-02 ENCOUNTER — Other Ambulatory Visit: Payer: Self-pay

## 2019-10-02 DIAGNOSIS — F411 Generalized anxiety disorder: Secondary | ICD-10-CM

## 2019-10-02 DIAGNOSIS — F331 Major depressive disorder, recurrent, moderate: Secondary | ICD-10-CM

## 2019-10-02 NOTE — BH Specialist Note (Signed)
Integrated Behavioral Health Follow Up Visit Via Phone  MRN: [id number] Name: Monica Moody  Number of [facility] [place] Clinician visits: 3/6  Type of Service: [place] Interpretor:No. Interpretor Name and Language:   SUBJECTIVE: Monica Moody is a 53 y.o. female accompanied by herself. Patient was referred by Carlyon Shadow NP for mental health. Patient reports the following symptoms/concerns: She explains that three of her grandchildren have had to go to the emergency room for different things in the past few weeks. She notes that her grandchildren are okay now and that's all that matters. She notes that she had to go to urgent care due to abdominal pain, had a ultra sound, and there was some kind of cyst so she has to see the gynecologist today. She explains that everything else has been the same. She reports that Thanksgiving was very different due to [place] 19 and took her mom a plate. She explains that she worries about her mom as well because of all her health problems. She explains that COVID 19 has been hard on the adults and her grandchildren as well. She notes that things with her depression and anxiety are about the same. She denies suicidal and homicidal thoughts.  Duration of problem: ; Severity of problem: moderate  OBJECTIVE: Mood: Euthymic and Affect: Appropriate Risk of harm to self or others: No plan to harm self or others  LIFE CONTEXT: Family and Social: see above. School/Work: see above. Self-Care: see above. Life Changes: see above.  GOALS ADDRESSED: Patient will: 1.  Reduce symptoms of: anxiety  2.  Increase knowledge and/or ability of: coping skills and stress reduction  3.  Demonstrate ability to: Increase healthy adjustment to current life circumstances  INTERVENTIONS: Interventions utilized:  Brief CBT was utilized by the clinician today focusing on the patient's anxiety. Clinician processed with the patient  regarding how she has been doing since the last follow up session. Clinician utilized reflective listening encouraging the patient to ventilate her feelings towards her current situation. Clinician explained to the patient that it sounds like in addition to her health problems that she has had a few scares with her grandchildren having to go to the ER. Clinician explained to the patient that its important that she take care of herself because if she cannot take care of herself then she surely cannot care for others.  Standardized Assessments completed: GAD-7 and PHQ 9  ASSESSMENT: Patient currently experiencing see above.   Patient may benefit from see above.  PLAN: 1. Follow up with behavioral health clinician on : two weeks or earlier if needed. 2. Behavioral recommendations: see above. 3. Referral(s): [facility] (In Clinic) 4. "From scale of 1-10, how likely are you to follow plan?":   Bayard Hugger, LCSW

## 2019-10-16 ENCOUNTER — Other Ambulatory Visit: Payer: Self-pay

## 2019-10-16 ENCOUNTER — Ambulatory Visit: Payer: Self-pay | Admitting: Family Medicine

## 2019-10-16 ENCOUNTER — Ambulatory Visit: Payer: Self-pay | Admitting: Licensed Clinical Social Worker

## 2019-10-16 ENCOUNTER — Encounter: Payer: Self-pay | Admitting: Family Medicine

## 2019-10-16 DIAGNOSIS — E781 Pure hyperglyceridemia: Secondary | ICD-10-CM

## 2019-10-16 DIAGNOSIS — I10 Essential (primary) hypertension: Secondary | ICD-10-CM

## 2019-10-16 DIAGNOSIS — F331 Major depressive disorder, recurrent, moderate: Secondary | ICD-10-CM

## 2019-10-16 DIAGNOSIS — Z1211 Encounter for screening for malignant neoplasm of colon: Secondary | ICD-10-CM

## 2019-10-16 DIAGNOSIS — K219 Gastro-esophageal reflux disease without esophagitis: Secondary | ICD-10-CM

## 2019-10-16 DIAGNOSIS — E785 Hyperlipidemia, unspecified: Secondary | ICD-10-CM

## 2019-10-16 DIAGNOSIS — R5383 Other fatigue: Secondary | ICD-10-CM

## 2019-10-16 DIAGNOSIS — F411 Generalized anxiety disorder: Secondary | ICD-10-CM

## 2019-10-16 DIAGNOSIS — E559 Vitamin D deficiency, unspecified: Secondary | ICD-10-CM

## 2019-10-16 MED ORDER — LANSOPRAZOLE 30 MG PO CPDR: DELAYED_RELEASE_CAPSULE | ORAL | 2 refills | Status: DC

## 2019-10-16 MED ORDER — HYDROCHLOROTHIAZIDE 25 MG PO TABS: 25.0000 mg | ORAL_TABLET | Freq: Every day | ORAL | 2 refills | Status: DC

## 2019-10-16 MED ORDER — LAMOTRIGINE 150 MG PO TABS: 75.0000 mg | ORAL_TABLET | Freq: Every day | ORAL | 2 refills | Status: DC

## 2019-10-16 MED ORDER — FENOFIBRATE 145 MG PO TABS: 145.0000 mg | ORAL_TABLET | Freq: Every day | ORAL | 2 refills | Status: DC

## 2019-10-16 MED ORDER — METOPROLOL TARTRATE 50 MG PO TABS: 50.0000 mg | ORAL_TABLET | Freq: Two times a day (BID) | ORAL | 2 refills | Status: DC

## 2019-10-16 MED ORDER — DULOXETINE HCL 60 MG PO CPEP: 60.0000 mg | ORAL_CAPSULE | Freq: Every day | ORAL | 2 refills | Status: DC

## 2019-10-16 NOTE — Progress Notes (Signed)
Virtual Visit via Telephone Note  I connected with Monica Moody on 10/16/19 at  6:15 PM EST by telephone and verified that I am speaking with the correct person using two identifiers.     I discussed the limitations, risks, security and privacy concerns of performing an evaluation and management service by telephone and the availability of in person appointments. I also discussed with the patient that there may be a patient responsible charge related to this service. The patient expressed understanding and agreed to proceed.   History of Present Illness: Patient presents for follow up on HTN, HLD, and GERD. Patient states that she is doing well on her current medications. She is followed behavioral health and states that her lamotrigine is now at 75 mg per day. She is requesting refills on all medications. She denies medication reactions and reports compliance with medications.    Observations/Objective: Patient is alert and oriented x 4. She does not sound to be in apparent distress.   Assessment and Plan:  1. Essential hypertension - Comprehensive metabolic panel; Future - CBC With Differential - Future; Future - HgB A1c; Future - hydrochlorothiazide (HYDRODIURIL) 25 MG tablet; Take 1 tablet (25 mg total) by mouth daily.  Dispense: 30 tablet; Refill: 2 - metoprolol tartrate (LOPRESSOR) 50 MG tablet; Take 1 tablet (50 mg total) by mouth 2 (two) times daily.  Dispense: 60 tablet; Refill: 2  2. Hyperlipidemia, unspecified hyperlipidemia type - TSH; Future - Lipid Panel With LDL/HDL Ratio; Future - HgB A1c; Future - fenofibrate (TRICOR) 145 MG tablet; Take 1 tablet (145 mg total) by mouth daily.  Dispense: 30 tablet; Refill: 2  3. Vitamin D deficiency  4. Hypertriglyceridemia  5. Colon cancer screening - Ambulatory referral to Gastroenterology  6. Fatigue, unspecified type  7. Moderate episode of recurrent major depressive disorder (HCC) - lamoTRIgine (LAMICTAL) 150 MG tablet;  Take 0.5 tablets (75 mg total) by mouth daily.  Dispense: 15 tablet; Refill: 2 - DULoxetine (CYMBALTA) 60 MG capsule; Take 1 capsule (60 mg total) by mouth daily.  Dispense: 30 capsule; Refill: 2  8. Gastroesophageal reflux disease without esophagitis - lansoprazole (PREVACID) 30 MG capsule; TAKE ONE CAPSULE BY MOUTH EVERY DAY BEFORE BREAKFAST  Dispense: 30 capsule; Refill: 2    Follow Up Instructions: Follow up in 3 months with labs 1 week prior.    I discussed the assessment and treatment plan with the patient. The patient was provided an opportunity to ask questions and all were answered. The patient agreed with the plan and demonstrated an understanding of the instructions.   The patient was advised to call back or seek an in-person evaluation if the symptoms worsen or if the condition fails to improve as anticipated.  I provided 13 minutes of non-face-to-face time during this encounter.   Lanae Boast, FNP

## 2019-10-16 NOTE — Patient Instructions (Addendum)
Referred for colonoscopy.      Health Maintenance, Female Adopting a healthy lifestyle and getting preventive care are important in promoting health and wellness. Ask your health care provider about:  The right schedule for you to have regular tests and exams.  Things you can do on your own to prevent diseases and keep yourself healthy. What should I know about diet, weight, and exercise? Eat a healthy diet   Eat a diet that includes plenty of vegetables, fruits, low-fat dairy products, and lean protein.  Do not eat a lot of foods that are high in solid fats, added sugars, or sodium. Maintain a healthy weight Body mass index (BMI) is used to identify weight problems. It estimates body fat based on height and weight. Your health care provider can help determine your BMI and help you achieve or maintain a healthy weight. Get regular exercise Get regular exercise. This is one of the most important things you can do for your health. Most adults should:  Exercise for at least 150 minutes each week. The exercise should increase your heart rate and make you sweat (moderate-intensity exercise).  Do strengthening exercises at least twice a week. This is in addition to the moderate-intensity exercise.  Spend less time sitting. Even light physical activity can be beneficial. Watch cholesterol and blood lipids Have your blood tested for lipids and cholesterol at 52 years of age, then have this test every 5 years. Have your cholesterol levels checked more often if:  Your lipid or cholesterol levels are high.  You are older than 52 years of age.  You are at high risk for heart disease. What should I know about cancer screening? Depending on your health history and family history, you may need to have cancer screening at various ages. This may include screening for:  Breast cancer.  Cervical cancer.  Colorectal cancer.  Skin cancer.  Lung cancer. What should I know about heart  disease, diabetes, and high blood pressure? Blood pressure and heart disease  High blood pressure causes heart disease and increases the risk of stroke. This is more likely to develop in people who have high blood pressure readings, are of African descent, or are overweight.  Have your blood pressure checked: ? Every 3-5 years if you are 76-52 years of age. ? Every year if you are 68 years old or older. Diabetes Have regular diabetes screenings. This checks your fasting blood sugar level. Have the screening done:  Once every three years after age 43 if you are at a normal weight and have a low risk for diabetes.  More often and at a younger age if you are overweight or have a high risk for diabetes. What should I know about preventing infection? Hepatitis B If you have a higher risk for hepatitis B, you should be screened for this virus. Talk with your health care provider to find out if you are at risk for hepatitis B infection. Hepatitis C Testing is recommended for:  Everyone born from [birth year] through 1964.  Anyone with known risk factors for hepatitis C. Sexually transmitted infections (STIs)  Get screened for STIs, including gonorrhea and chlamydia, if: ? You are sexually active and are younger than 52 years of age. ? You are older than 52 years of age and your health care provider tells you that you are at risk for this type of infection. ? Your sexual activity has changed since you were last screened, and you are at increased risk for chlamydia  or gonorrhea. Ask your health care provider if you are at risk.  Ask your health care provider about whether you are at high risk for HIV. Your health care provider may recommend a prescription medicine to help prevent HIV infection. If you choose to take medicine to prevent HIV, you should first get tested for HIV. You should then be tested every 3 months for as long as you are taking the medicine. Pregnancy  If you are about to stop  having your period (premenopausal) and you may become pregnant, seek counseling before you get pregnant.  Take 400 to 800 micrograms (mcg) of folic acid every day if you become pregnant.  Ask for birth control (contraception) if you want to prevent pregnancy. Osteoporosis and menopause Osteoporosis is a disease in which the bones lose minerals and strength with aging. This can result in bone fractures. If you are 66 years old or older, or if you are at risk for osteoporosis and fractures, ask your health care provider if you should:  Be screened for bone loss.  Take a calcium or vitamin D supplement to lower your risk of fractures.  Be given hormone replacement therapy (HRT) to treat symptoms of menopause. Follow these instructions at home: Lifestyle  Do not use any products that contain nicotine or tobacco, such as cigarettes, e-cigarettes, and chewing tobacco. If you need help quitting, ask your health care provider.  Do not use street drugs.  Do not share needles.  Ask your health care provider for help if you need support or information about quitting drugs. Alcohol use  Do not drink alcohol if: ? Your health care provider tells you not to drink. ? You are pregnant, may be pregnant, or are planning to become pregnant.  If you drink alcohol: ? Limit how much you use to 0-1 drink a day. ? Limit intake if you are breastfeeding.  Be aware of how much alcohol is in your drink. In the [place] one drink equals one 12 oz bottle of beer (355 mL), one 5 oz glass of wine (148 mL), or one 1 oz glass of hard liquor (44 mL). General instructions  Schedule regular health, dental, and eye exams.  Stay current with your vaccines.  Tell your health care provider if: ? You often feel depressed. ? You have ever been abused or do not feel safe at home. Summary  Adopting a healthy lifestyle and getting preventive care are important in promoting health and wellness.  Follow your health  care provider's instructions about healthy diet, exercising, and getting tested or screened for diseases.  Follow your health care provider's instructions on monitoring your cholesterol and blood pressure. This information is not intended to replace advice given to you by your health care provider. Make sure you discuss any questions you have with your health care provider. Document Released: 05/01/2011 Document Revised: 10/09/2018 Document Reviewed: 10/09/2018 Elsevier Patient Education  2020 [employer].

## 2019-10-16 NOTE — BH Specialist Note (Signed)
Integrated Behavioral Health Follow Up Visit Via Phone  MRN: [id number] Name: Monica Moody  Number of [place] Clinician visits: 4/6   Type of Service: [place] Interpretor:No. Interpretor Name and Language: Not applicable.   SUBJECTIVE: Monica Moody is a 53 y.o. female accompanied by herself. Patient was referred by Carlyon Shadow NP for mental health. Patient reports the following symptoms/concerns: She notes that she has been doing okay. She explains that she was prescribed a medication but is experiencing anxiety about taking it due to the potential side effects. She notes that her anxiety is pretty stable. She notes that she has occasional times where its out of wack. She asked about resources that was mentioned at the last session. She notes that she feels bored a good portion of the time and feels like she needs stuff to occupy her time. She denies suicidal and homicidal thoughts.  Duration of problem: ; Severity of problem: moderate  OBJECTIVE: Mood: Euthymic and Affect: Appropriate Risk of harm to self or others: No plan to harm self or others  LIFE CONTEXT: Family and Social: see above. School/Work: see above. Self-Care: see above. Life Changes: see above.  GOALS ADDRESSED: Patient will: 1.  Reduce symptoms of: depression  2.  Increase knowledge and/or ability of: coping skills and self-management skills  3.  Demonstrate ability to: Increase healthy adjustment to current life circumstances  INTERVENTIONS: Interventions utilized:  Veterinary surgeon and Link to [employer] was utilized by the clinician during today's follow up session. Clinician processed with the patient regarding how she has been doing since the last follow up session. Clinician provided the patient with contact information for the resources for a support group and other services. Clinician administered a depression and anxiety  screening to monitor her symptoms on a numerical scale. Clinician suggested that the patient look into finding a place to volunteer like the food pantry at [employer]. Clinician explained to the patient that she thinks if she has more activities to occupy her time, that she will have less time to dwell on things that are out of her control.  Standardized Assessments completed: GAD-7 and PHQ 9  ASSESSMENT: Patient currently experiencing  see above.  Patient may benefit from see above.  PLAN: 1. Follow up with behavioral health clinician on : two weeks or earlier if needed. 2. Behavioral recommendations: see above. 3. Referral(s): [place] (In Clinic) 4. "From scale of 1-10, how likely are you to follow plan?":   Bayard Hugger, LCSW

## 2019-10-20 ENCOUNTER — Other Ambulatory Visit: Payer: Self-pay

## 2019-10-20 ENCOUNTER — Telehealth: Payer: Self-pay

## 2019-10-20 DIAGNOSIS — Z1211 Encounter for screening for malignant neoplasm of colon: Secondary | ICD-10-CM

## 2019-10-20 MED ORDER — PEG 3350-KCL-NA BICARB-NACL 420 G PO SOLR: 4000.0000 mL | Freq: Once | ORAL | 0 refills | Status: AC

## 2019-10-20 NOTE — Telephone Encounter (Signed)
Gastroenterology Pre-Procedure Review    PATIENT REVIEW QUESTIONS: The patient responded to the following health history questions as indicated:    1. Are you having any GI issues? no 2. Do you have a personal history of Polyps? no 3. Do you have a family history of Colon Cancer or Polyps? Yes Dad had Colon Polyps and Diverticulitis , Sister had diverticulitis  4. Diabetes Mellitus? no 5. Joint replacements in the past 12 months?no 6. Major health problems in the past 3 months?no 7. Any artificial heart valves, MVP, or defibrillator?no    MEDICATIONS & ALLERGIES:    Patient reports the following regarding taking any anticoagulation/antiplatelet therapy:   Plavix, Coumadin, Eliquis, Xarelto, Lovenox, Pradaxa, Brilinta, or Effient? no Aspirin? no  Patient confirms/reports the following medications:  Current Outpatient Medications  Medication Sig Dispense Refill  . Biotin 10000 MCG TABS Take 2,000 mcg by mouth daily.    . cholecalciferol (VITAMIN D) 1000 UNITS tablet Take 1,000 Units by mouth daily.     . DULoxetine (CYMBALTA) 60 MG capsule Take 1 capsule (60 mg total) by mouth daily. 30 capsule 2  . fenofibrate (TRICOR) 145 MG tablet Take 1 tablet (145 mg total) by mouth daily. 30 tablet 2  . hydrochlorothiazide (HYDRODIURIL) 25 MG tablet Take 1 tablet (25 mg total) by mouth daily. 30 tablet 2  . ibuprofen (ADVIL,MOTRIN) 800 MG tablet Take 1 tablet (800 mg total) by mouth every 8 (eight) hours as needed. 30 tablet 1  . lamoTRIgine (LAMICTAL) 150 MG tablet Take 0.5 tablets (75 mg total) by mouth daily. 15 tablet 2  . lansoprazole (PREVACID) 30 MG capsule TAKE ONE CAPSULE BY MOUTH EVERY DAY BEFORE BREAKFAST 30 capsule 2  . metoprolol tartrate (LOPRESSOR) 50 MG tablet Take 1 tablet (50 mg total) by mouth 2 (two) times daily. 60 tablet 2  . Multiple Vitamin (MULTIVITAMIN) tablet Take 1 tablet by mouth daily.    . vitamin E 400 UNIT capsule Take 400 Units by mouth daily.      No current  facility-administered medications for this visit.    Patient confirms/reports the following allergies:  Allergies  Allergen Reactions  . Bupropion Other (See Comments)    Hands peel  . Celexa [Citalopram Hydrobromide] Other (See Comments)    Affects heart  . Penicillins Swelling    Has patient had a PCN reaction causing immediate rash, facial/tongue/throat swelling, SOB or lightheadedness with hypotension: Yes Has patient had a PCN reaction causing severe rash involving mucus membranes or skin necrosis: No Has patient had a PCN reaction that required hospitalization: No Has patient had a PCN reaction occurring within the last 10 years: Unknown If all of the above answers are "NO", then may proceed with Cephalosporin use.     No orders of the defined types were placed in this encounter.   AUTHORIZATION INFORMATION Primary Insurance: 1D#: Group #:  Secondary Insurance: 1D#: Group #:  SCHEDULE INFORMATION: Date:  Time: Location:

## 2019-10-22 ENCOUNTER — Encounter: Payer: Self-pay | Admitting: Gastroenterology

## 2019-10-22 ENCOUNTER — Other Ambulatory Visit: Payer: Self-pay

## 2019-10-28 NOTE — Discharge Instructions (Signed)

## 2019-10-30 ENCOUNTER — Other Ambulatory Visit
Admission: RE | Admit: 2019-10-30 | Discharge: 2019-10-30 | Disposition: A | Discharge status: Home / Self Care | Payer: HRSA Program | Source: Ambulatory Visit | Attending: Gastroenterology | Admitting: Gastroenterology

## 2019-10-30 ENCOUNTER — Other Ambulatory Visit: Payer: Self-pay

## 2019-10-30 DIAGNOSIS — Z01812 Encounter for preprocedural laboratory examination: Secondary | ICD-10-CM | POA: Insufficient documentation

## 2019-10-30 DIAGNOSIS — Z20828 Contact with and (suspected) exposure to other viral communicable diseases: Secondary | ICD-10-CM | POA: Insufficient documentation

## 2019-10-30 LAB — SARS CORONAVIRUS 2 (TAT 6-24 HRS): SARS Coronavirus 2: NEGATIVE

## 2019-11-03 ENCOUNTER — Encounter
Admission: RE | Disposition: A | Discharge status: Home / Self Care | Payer: Self-pay | Source: Home / Self Care | Attending: Gastroenterology

## 2019-11-03 ENCOUNTER — Other Ambulatory Visit: Payer: Self-pay

## 2019-11-03 ENCOUNTER — Ambulatory Visit
Admission: RE | Admit: 2019-11-03 | Discharge: 2019-11-03 | Disposition: A | Discharge status: Home / Self Care | Payer: Self-pay | Attending: Gastroenterology | Admitting: Gastroenterology

## 2019-11-03 ENCOUNTER — Ambulatory Visit: Payer: Self-pay | Admitting: Anesthesiology

## 2019-11-03 ENCOUNTER — Encounter: Payer: Self-pay | Admitting: Gastroenterology

## 2019-11-03 DIAGNOSIS — Z880 Allergy status to penicillin: Secondary | ICD-10-CM | POA: Insufficient documentation

## 2019-11-03 DIAGNOSIS — I10 Essential (primary) hypertension: Secondary | ICD-10-CM | POA: Insufficient documentation

## 2019-11-03 DIAGNOSIS — F17210 Nicotine dependence, cigarettes, uncomplicated: Secondary | ICD-10-CM | POA: Insufficient documentation

## 2019-11-03 DIAGNOSIS — Z8249 Family history of ischemic heart disease and other diseases of the circulatory system: Secondary | ICD-10-CM | POA: Insufficient documentation

## 2019-11-03 DIAGNOSIS — E785 Hyperlipidemia, unspecified: Secondary | ICD-10-CM | POA: Insufficient documentation

## 2019-11-03 DIAGNOSIS — F329 Major depressive disorder, single episode, unspecified: Secondary | ICD-10-CM | POA: Insufficient documentation

## 2019-11-03 DIAGNOSIS — Z1211 Encounter for screening for malignant neoplasm of colon: Secondary | ICD-10-CM | POA: Insufficient documentation

## 2019-11-03 DIAGNOSIS — Z6831 Body mass index (BMI) 31.0-31.9, adult: Secondary | ICD-10-CM | POA: Insufficient documentation

## 2019-11-03 DIAGNOSIS — Z8371 Family history of colonic polyps: Secondary | ICD-10-CM | POA: Insufficient documentation

## 2019-11-03 DIAGNOSIS — F419 Anxiety disorder, unspecified: Secondary | ICD-10-CM | POA: Insufficient documentation

## 2019-11-03 DIAGNOSIS — E669 Obesity, unspecified: Secondary | ICD-10-CM | POA: Insufficient documentation

## 2019-11-03 DIAGNOSIS — Z79899 Other long term (current) drug therapy: Secondary | ICD-10-CM | POA: Insufficient documentation

## 2019-11-03 DIAGNOSIS — K219 Gastro-esophageal reflux disease without esophagitis: Secondary | ICD-10-CM | POA: Insufficient documentation

## 2019-11-03 DIAGNOSIS — Z888 Allergy status to other drugs, medicaments and biological substances status: Secondary | ICD-10-CM | POA: Insufficient documentation

## 2019-11-03 HISTORY — DX: Other complications of anesthesia, initial encounter: T88.59XA

## 2019-11-03 HISTORY — PX: COLONOSCOPY WITH PROPOFOL: SHX5780

## 2019-11-03 HISTORY — DX: Headache, unspecified: R51.9

## 2019-11-03 LAB — POCT PREGNANCY, URINE: Preg Test, Ur: NEGATIVE

## 2019-11-03 SURGERY — COLONOSCOPY WITH PROPOFOL
Anesthesia: General | Site: Rectum

## 2019-11-03 MED ORDER — LACTATED RINGERS IV SOLN: INTRAVENOUS | Status: DC

## 2019-11-03 MED ORDER — STERILE WATER FOR IRRIGATION IR SOLN
Status: DC | PRN
  Administered 2019-11-03: 09:00:00 100 mL

## 2019-11-03 MED ORDER — PROPOFOL 10 MG/ML IV BOLUS
INTRAVENOUS | Status: DC | PRN
  Administered 2019-11-03 (×2): 20 mg via INTRAVENOUS
  Administered 2019-11-03 (×2): 50 mg via INTRAVENOUS
  Administered 2019-11-03 (×5): 20 mg via INTRAVENOUS
  Administered 2019-11-03: 80 mg via INTRAVENOUS
  Administered 2019-11-03: 20 mg via INTRAVENOUS
  Administered 2019-11-03: 50 mg via INTRAVENOUS

## 2019-11-03 MED ORDER — LIDOCAINE HCL (CARDIAC) PF 100 MG/5ML IV SOSY
PREFILLED_SYRINGE | INTRAVENOUS | Status: DC | PRN
  Administered 2019-11-03: 30 mg via INTRAVENOUS

## 2019-11-03 SURGICAL SUPPLY — 5 items
CANISTER SUCT 1200ML W/VALVE (MISCELLANEOUS) ×3 IMPLANT
GOWN CVR UNV OPN BCK APRN NK (MISCELLANEOUS) ×2 IMPLANT
GOWN ISOL THUMB LOOP REG UNIV (MISCELLANEOUS) ×4
KIT ENDO PROCEDURE OLY (KITS) ×3 IMPLANT
WATER STERILE IRR 250ML POUR (IV SOLUTION) ×3 IMPLANT

## 2019-11-03 NOTE — Anesthesia Preprocedure Evaluation (Signed)
Anesthesia Evaluation  Patient identified by MRN, date of birth, ID band Patient awake    History of Anesthesia Complications Negative for: history of anesthetic complications  Airway Mallampati: III  TM Distance: >3 FB Neck ROM: Full    Dental no notable dental hx.    Pulmonary Current Smoker (1/2 ppd) and Patient abstained from smoking.,    Pulmonary exam normal        Cardiovascular Exercise Tolerance: Good hypertension, Pt. on medications and Pt. on home beta blockers (-) anginaNormal cardiovascular exam     Neuro/Psych PSYCHIATRIC DISORDERS Depression    GI/Hepatic Neg liver ROS, GERD  Medicated,  Endo/Other  negative endocrine ROS  Renal/GU negative Renal ROS     Musculoskeletal negative musculoskeletal ROS (+)   Abdominal   Peds  Hematology negative hematology ROS (+)   Anesthesia Other Findings   Reproductive/Obstetrics                             Anesthesia Physical Anesthesia Plan  ASA: II  Anesthesia Plan: General   Post-op Pain Management:    Induction: Intravenous  PONV Risk Score and Plan: 2 and Treatment may vary due to age or medical condition, TIVA and Propofol infusion  Airway Management Planned: Nasal Cannula  Additional Equipment: None  Intra-op Plan:   Post-operative Plan:   Informed Consent: I have reviewed the patients History and Physical, chart, labs and discussed the procedure including the risks, benefits and alternatives for the proposed anesthesia with the patient or authorized representative who has indicated his/her understanding and acceptance.       Plan Discussed with: CRNA  Anesthesia Plan Comments:         Anesthesia Quick Evaluation
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
## 2019-11-03 NOTE — Transfer of Care (Signed)
Immediate Anesthesia Transfer of Care Note  Patient: Monica Moody  Procedure(s) Performed: COLONOSCOPY WITH PROPOFOL (N/A Rectum)  Patient Location: PACU  Anesthesia Type: General  Level of Consciousness: awake, alert  and patient cooperative  Airway and Oxygen Therapy: Patient Spontanous Breathing and Patient connected to supplemental oxygen  Post-op Assessment: Post-op Vital signs reviewed, Patient's Cardiovascular Status Stable, Respiratory Function Stable, Patent Airway and No signs of Nausea or vomiting  Post-op Vital Signs: Reviewed and stable  Complications: No apparent anesthesia complications

## 2019-11-03 NOTE — Anesthesia Procedure Notes (Signed)
Procedure Name: MAC Date/Time: 11/03/2019 9:10 AM Performed by: Georga Bora, CRNA Pre-anesthesia Checklist: Patient identified, Suction available, Patient being monitored, Emergency Drugs available and Timeout performed Patient Re-evaluated:Patient Re-evaluated prior to induction Oxygen Delivery Method: Nasal cannula

## 2019-11-03 NOTE — Anesthesia Postprocedure Evaluation (Signed)
Anesthesia Post Note  Patient: Monica Moody  Procedure(s) Performed: COLONOSCOPY WITH PROPOFOL (N/A Rectum)     Patient location during evaluation: PACU Anesthesia Type: General Level of consciousness: awake and alert Pain management: pain level controlled Vital Signs Assessment: post-procedure vital signs reviewed and stable Respiratory status: spontaneous breathing, nonlabored ventilation, respiratory function stable and patient connected to nasal cannula oxygen Cardiovascular status: blood pressure returned to baseline and stable Postop Assessment: no apparent nausea or vomiting Anesthetic complications: no    Wille Celeste Devarious Pavek

## 2019-11-03 NOTE — Op Note (Signed)
[facility] Patient Name: Monica Moody Procedure Date: 11/03/2019 9:06 AM MRN: [id number] Account #: [id number] Date of Birth: 1966/04/16 Admit Type: Outpatient Age: 54 Room: [facility] OR ROOM 01 Gender: Female Note Status: Finalized Procedure:             Colonoscopy Indications:           Colon cancer screening in patient at increased risk:                         Family history of 1st-degree relative with colon polyps Providers:             Jonathon Bellows MD, MD Referring MD:          - -. [facility], MD (Referring MD) Medicines:             Monitored Anesthesia Care Complications:         No immediate complications. Procedure:             Pre-Anesthesia Assessment:                        - Prior to the procedure, a History and Physical was                         performed, and patient medications, allergies and                         sensitivities were reviewed. The patient's tolerance                         of previous anesthesia was reviewed.                        - The risks and benefits of the procedure and the                         sedation options and risks were discussed with the                         patient. All questions were answered and informed                         consent was obtained.                        - ASA Grade Assessment: II - A patient with mild                         systemic disease.                        After obtaining informed consent, the colonoscope was                         passed under direct vision. Throughout the procedure,                         the patient's blood pressure, pulse, and oxygen  saturations were monitored continuously. The was                         introduced through the anus and advanced to the the                         cecum, identified by the appendiceal orifice. The                         colonoscopy was performed with ease. The patient            tolerated the procedure well. The quality of the bowel                         preparation was excellent. Findings:      The perianal and digital rectal examinations were normal.      The entire examined colon appeared normal on direct and retroflexion       views. Impression:            - The entire examined colon is normal on direct and                         retroflexion views.                        - No specimens collected. Recommendation:        - Discharge patient to home (with escort).                        - Resume previous diet.                        - Continue present medications.                        - Repeat colonoscopy in 5 years for screening purposes. Procedure Code(s):     --- Professional ---                        [phone], Colonoscopy, flexible; diagnostic, including                         collection of specimen(s) by brushing or washing, when                         performed (separate procedure) Diagnosis Code(s):     --- Professional ---                        Z83.71, Family history of colonic polyps CPT copyright 2019 American Medical Association. All rights reserved. The codes documented in this report are preliminary and upon coder review may  be revised to meet current compliance requirements. [name], MD [name] MD, MD 11/03/2019 9:36:15 AM This report has been signed electronically. Number of Addenda: 0 Note Initiated On: 11/03/2019 9:06 AM Scope Withdrawal Time: 0 hours 15 minutes 55 seconds  Total Procedure Duration: 0 hours 19 minutes 58 seconds  Estimated Blood Loss:  Estimated blood loss: none.      [facility]

## 2019-11-04 ENCOUNTER — Ambulatory Visit: Payer: Self-pay | Admitting: Licensed Clinical Social Worker

## 2019-11-04 ENCOUNTER — Encounter: Payer: Self-pay | Admitting: *Deleted

## 2019-11-04 DIAGNOSIS — F331 Major depressive disorder, recurrent, moderate: Secondary | ICD-10-CM

## 2019-11-04 DIAGNOSIS — F411 Generalized anxiety disorder: Secondary | ICD-10-CM

## 2019-11-04 NOTE — BH Specialist Note (Signed)
Integrated Behavioral Health Follow Up Visit Via Phone  MRN: [id number] Name: Monica Moody  Number of Integrated Behavioral Health Clinician visits: 5/6  Type of Service: Integrated Behavioral Health- Individual/Family Interpretor:No. Interpretor Name and Language: not applicable.   SUBJECTIVE: Monica Moody is a 54 y.o. female accompanied by herself. Patient was referred by Hurman Horn NP for mental health. Patient reports the following symptoms/concerns: She explains that she feels like she is in a rut. She discussed a decision she has been pondering making and the fear of the potential consequences of following through with the decision. 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 She denies suicidal and homicidal thoughts.  Duration of problem: ; Severity of problem: moderate  OBJECTIVE: Mood: Euthymic and Affect: Appropriate Risk of harm to self or others: No plan to harm self or others  LIFE CONTEXT: Family and Social: see above.  School/Work: see above. Self-Care: see above. Life Changes: see above.  GOALS ADDRESSED: Patient will: 1.  Reduce symptoms of: anxiety  2.  Increase knowledge and/or ability of: self-management skills  3.  Demonstrate ability to: Increase healthy adjustment to current life circumstances  INTERVENTIONS: Interventions utilized:  Supportive Counseling was utilized by the clinician during today's follow up session focusing on the patient's anxiety. Clinician processed with the patient regarding how she has been doing since the last follow up session. Clinician dicussed with the patient the anxiety that she has been experiencing regarding making a potential decision and what the consequences could be. Clinician suggested to the patient that she contact the resources that she provided her with at the last follow up session. Clinician  provided the patient with contact information for the women's resource center who may be able to help her come up with some concrete goals for the future.  Standardized Assessments completed: GAD-7 and PHQ 9  ASSESSMENT: Patient currently experiencing see above.   Patient may benefit from see above.  PLAN: 1. Follow up with behavioral health clinician on : two weeks or earlier if needed. 2. Behavioral recommendations: see above.  3. Referral(s): Integrated [employer] (In Clinic) 4. "From scale of 1-10, how likely are you to follow plan?":   Althia Forts, LCSW

## 2019-11-20 ENCOUNTER — Telehealth: Payer: Self-pay | Admitting: Licensed Clinical Social Worker

## 2019-11-20 ENCOUNTER — Ambulatory Visit: Payer: Self-pay | Admitting: Licensed Clinical Social Worker

## 2019-11-20 NOTE — Telephone Encounter (Signed)
Clinician attempted to contact the patient twice during their scheduled phone visit and left voicemail with call back information.  

## 2019-11-27 ENCOUNTER — Other Ambulatory Visit: Payer: Self-pay

## 2019-11-27 ENCOUNTER — Ambulatory Visit: Payer: Self-pay | Admitting: Licensed Clinical Social Worker

## 2019-11-27 DIAGNOSIS — F411 Generalized anxiety disorder: Secondary | ICD-10-CM

## 2019-11-27 DIAGNOSIS — F331 Major depressive disorder, recurrent, moderate: Secondary | ICD-10-CM

## 2019-11-27 NOTE — BH Specialist Note (Signed)
Integrated Behavioral Health Follow Up Visit Via Phone  MRN: [id number] Name: Monica Moody  Type of Service: Integrated Behavioral Health- Individual/Family Interpretor:No. Interpretor Name and Language: not applicable.   SUBJECTIVE: Monica Moody is a 54 y.o. female accompanied by herself. Patient was referred by Hurman Horn NP for mental health.  Patient reports the following symptoms/concerns: She discussed things that happened in her past that she regrets has shaped her present. She discussed family dynamics that she experienced growing up. She denies suicidal and homicidal thoughts.  Duration of problem: ; Severity of problem: moderate  OBJECTIVE: Mood: Euthymic and Affect: Appropriate Risk of harm to self or others: No plan to harm self or others  LIFE CONTEXT: Family and Social: see above. School/Work: see above. Self-Care: see above. Life Changes: see above.  GOALS ADDRESSED: Patient will: 1.  Reduce symptoms of: stress  2.  Increase knowledge and/or ability of: coping skills and self-management skills  3.  Demonstrate ability to: Increase healthy adjustment to current life circumstances  INTERVENTIONS: Interventions utilized:  Supportive Counseling was utilized by the clinician during today's follow up session. Clinician processed with the patient regarding how she has been doing since the last follow up session. Clinician explained to the patient that she cannot go back in time to change her decisions or life's trajectory but she can working on being in the present. Clinician explained to the patient that she wants her to rethink contacting one of the resources that she provided with her two weeks ago to assist her with her goals and the changes she wants to make in life.  Standardized Assessments completed: GAD-7 and PHQ 9  ASSESSMENT: Patient currently experiencing see above.   Patient may benefit from see above.  PLAN: 1. Follow up with behavioral health  clinician on :  2. Behavioral recommendations:see above. 3. Referral(s): Integrated [employer] (In Clinic) 4. "From scale of 1-10, how likely are you to follow plan?":   Althia Forts, LCSW

## 2019-12-11 ENCOUNTER — Ambulatory Visit: Payer: Self-pay | Admitting: Licensed Clinical Social Worker

## 2019-12-25 ENCOUNTER — Other Ambulatory Visit: Payer: Self-pay

## 2019-12-25 ENCOUNTER — Ambulatory Visit: Payer: Self-pay | Admitting: Licensed Clinical Social Worker

## 2019-12-25 DIAGNOSIS — F411 Generalized anxiety disorder: Secondary | ICD-10-CM

## 2019-12-25 DIAGNOSIS — F331 Major depressive disorder, recurrent, moderate: Secondary | ICD-10-CM

## 2019-12-25 NOTE — BH Specialist Note (Signed)
Integrated Behavioral Health Follow Up Visit Via Phone  MRN: [id number] Name: Anavictoria Wilk Robinson  Type of Service: Integrated Behavioral Health- Individual/Family Interpretor:No. Interpretor Name and Language: not applicable.  SUBJECTIVE: [place] F Zuk is a 54 y.o. female accompanied by herself. Patient was referred by Hurman Horn NP for mental health. Patient reports the following symptoms/concerns: She notes that she is going to beach this weekend for a few days. She notes that she called one of the resources she was given at the last session. She notes that not much as changed since they last had a session. She notes that she is trying to pray, reading a codpendency book, and reflecting on the past. 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 She discussed situational stressors with family dynamics. She denies suicidal and homicidal thoughts.  Duration of problem: ; Severity of problem: moderate  OBJECTIVE: Mood: Euthymic and Affect: Appropriate Risk of harm to self or others: No plan to harm self or others  LIFE CONTEXT: Family and Social: See above. School/Work: See above. Self-Care: See above. Life Changes: See above.  GOALS ADDRESSED: Patient will: 1.  Reduce symptoms of: stress  2.  Increase knowledge and/or ability of: coping skills and self-management skills  3.  Demonstrate ability to: Increase healthy adjustment to current life circumstances  INTERVENTIONS: Interventions utilized:  Supportive Counseling was utilized by the clinician during today's follow up session. Clinician processed with the patient regarding how she has been doing since the last follow up session. Clinician encouraged the patient to focus on taking the time to practice self care on her vacation and contact the agency for assistance when she is back in town.  Standardized Assessments completed: GAD-7 and PHQ 9  ASSESSMENT: Patient currently experiencing See  above. Patient may benefit from See above.  PLAN: 1. Follow up with behavioral health clinician on :  2. Behavioral recommendations: See above. 3. Referral(s): Integrated [employer] (In Clinic) 4. "From scale of 1-10, how likely are you to follow plan?":   Althia Forts, LCSW

## 2020-01-07 ENCOUNTER — Other Ambulatory Visit: Payer: Self-pay

## 2020-01-08 ENCOUNTER — Ambulatory Visit: Payer: Self-pay | Admitting: Licensed Clinical Social Worker

## 2020-01-13 ENCOUNTER — Encounter: Payer: Self-pay | Admitting: Licensed Clinical Social Worker

## 2020-01-13 ENCOUNTER — Ambulatory Visit: Payer: Self-pay | Admitting: Licensed Clinical Social Worker

## 2020-01-13 ENCOUNTER — Other Ambulatory Visit: Payer: Self-pay

## 2020-01-13 DIAGNOSIS — F331 Major depressive disorder, recurrent, moderate: Secondary | ICD-10-CM

## 2020-01-13 DIAGNOSIS — F411 Generalized anxiety disorder: Secondary | ICD-10-CM

## 2020-01-13 NOTE — BH Specialist Note (Signed)
Integrated Behavioral Health Follow Up Visit Via Phone  MRN: [id number] Name: Monica Moody  Type of Service: Integrated Behavioral Health- Individual/Family Interpretor:No. Interpretor Name and Language: see above.   SUBJECTIVE: Monica Moody is a 54 y.o. female accompanied by herself. Patient was referred by Hurman Horn NP for mental health.  Patient reports the following symptoms/concerns: She notes that she had a relaxing trip to the beach and enjoyed it. She notes that she has yet to contact the organization that she was referred to yet. 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 She notes that change is happening with the kids going back to school part time. She notes that she has been doing a lot of praying and that's helping with the anxiety. She explains that she feels like she is never really able to relax. 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 She denies suicidal and homicidal thoughts.  Duration of problem: ; Severity of problem: moderate  OBJECTIVE: Mood: Euthymic and Affect: Appropriate Risk of harm to self or others: No plan to harm self or others  LIFE CONTEXT: Family and Social: see above. School/Work: see above. Self-Care: see above. Life Changes: see above.   GOALS ADDRESSED: Patient will: 1.  Reduce symptoms of: anxiety  2.  Increase knowledge and/or ability of: self-management skills and stress reduction  3.  Demonstrate ability to: Increase healthy adjustment to current life circumstances  INTERVENTIONS: Interventions utilized:  Supportive Counseling was utilized by the clinician during today's follow up session. Clinician processed with the patient regarding how she has been doing since the last follow up session. Clinician discussed with the patient  regarding how things went at the beach. Clinician measured the patient's anxiety and depression on a numerical scale. 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 Clinician encouraged the patient to practice self care and continue to utilize her coping skills.  Standardized Assessments completed: GAD-7 and PHQ 9  ASSESSMENT: Patient currently experiencing see above.   Patient may benefit from see above.  PLAN: 1. Follow up with behavioral health clinician on :  2. Behavioral recommendations: see above. 3. Referral(s): Integrated [employer] (In Clinic) 4. "From scale of 1-10, how likely are you to follow plan?":   Althia Forts, LCSW

## 2020-01-14 ENCOUNTER — Ambulatory Visit: Payer: Self-pay | Admitting: Gerontology

## 2020-01-26 ENCOUNTER — Telehealth: Payer: Self-pay | Admitting: Pharmacy Technician

## 2020-01-26 NOTE — Telephone Encounter (Signed)
Received updated proof of income.  Patient eligible to receive medication assistance at [facility] until time for re-certification in 9359, and as long as eligibility requirements continue to be met.  [facility]

## 2020-01-27 ENCOUNTER — Other Ambulatory Visit: Payer: Self-pay

## 2020-01-27 ENCOUNTER — Ambulatory Visit: Payer: Self-pay | Admitting: Licensed Clinical Social Worker

## 2020-01-27 DIAGNOSIS — F331 Major depressive disorder, recurrent, moderate: Secondary | ICD-10-CM

## 2020-01-27 DIAGNOSIS — F411 Generalized anxiety disorder: Secondary | ICD-10-CM

## 2020-01-27 MED ORDER — LAMOTRIGINE 150 MG PO TABS: 75.0000 mg | ORAL_TABLET | Freq: Every day | ORAL | 2 refills | Status: DC

## 2020-01-27 MED ORDER — DULOXETINE HCL 60 MG PO CPEP: 60.0000 mg | ORAL_CAPSULE | Freq: Every day | ORAL | 2 refills | Status: DC

## 2020-01-27 NOTE — BH Specialist Note (Signed)
Integrated Behavioral Health Follow Up Visit Via Phone  MRN: [id number] Name: Monica Moody  Type of Service: Integrated Behavioral Health- Individual/Family Interpretor:No. Interpretor Name and Language: not applicable.   SUBJECTIVE: Monica Moody is a 54 y.o. female accompanied by herself. Patient was referred by Hurman Horn NP for mental health. Patient reports the following symptoms/concerns: She discussed situational stressors with family dynamics that are bothering her. She explains that these situational stressors remind her of negative things from the past that happened to her. 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 She notes that it bothers her and is going to try to keep herself busy until the phlebotomy classes start at [employer]. She explains that she plans to look for a job but needs to help her mom with some different things and get her situated at her house. She explains that she has been having weird dreams about the house shaking and another night she was shaking. She discussed at times feeling a tightness in her neck. She denies suicidal and homicidal thoughts.  Duration of problem: ; Severity of problem: moderate  OBJECTIVE: Mood: Euthymic and Affect: Appropriate Risk of harm to self or others: No plan to harm self or others  LIFE CONTEXT: Family and Social: See above. School/Work: See above. Self-Care: See above. Life Changes: See above.   GOALS ADDRESSED: Patient will: 1.  Reduce symptoms of: anxiety  2.  Increase knowledge and/or ability of: coping skills and stress reduction  3.  Demonstrate ability to: Increase healthy adjustment to current life circumstances  INTERVENTIONS: Interventions utilized:  Supportive Counseling was utilized by the clinician during today's follow up session. Clinician processed with the patient regarding  how she has been doing since the last follow up session. Clinician measured the patient's anxiety and depression on a numerical scale. Clinician utilized reflective listening encouraging the patient to ventilate her feelings towards her current situation. Clinician discussed relaxation techniques with the patient; deep breathing, progressive muscle relaxation, guided imagery, and challenging her anxious thoughts.  Standardized Assessments completed: GAD-7 and PHQ 9  ASSESSMENT: Patient currently experiencing see above.   Patient may benefit from see above.  PLAN: 1. Follow up with behavioral health clinician on : two weeks or earlier if needed.  2. Behavioral recommendations: see above.  3. Referral(s): Integrated [employer] (In Clinic) 4. "From scale of 1-10, how likely are you to follow plan?":   Althia Forts, LCSW

## 2020-01-29 ENCOUNTER — Other Ambulatory Visit: Payer: Self-pay

## 2020-01-29 ENCOUNTER — Telehealth: Payer: Self-pay | Admitting: Pharmacist

## 2020-01-29 DIAGNOSIS — E785 Hyperlipidemia, unspecified: Secondary | ICD-10-CM

## 2020-01-29 DIAGNOSIS — I10 Essential (primary) hypertension: Secondary | ICD-10-CM

## 2020-01-29 NOTE — Telephone Encounter (Signed)
01/29/2020 8:55:48 AM - Cymbalta change in provider to [facility]  -- Rhetta Mura - Thursday, January 29, 2020 8:53 AM --I see that Lanora Manis @ [facility] has written script for Cymbalta 60mg  Take 1 daily--changed provider from [facility] to [place]. I will send get [place] to sign provider portion, and put patient portion in bag with meds on the wall for patient to sign. Cymbalta will replace Duloxetine.

## 2020-01-31 LAB — CBC WITH DIFFERENTIAL
Basophils Absolute: 0.1 10*3/uL (ref 0.0–0.2)
Basos: 1 %
EOS (ABSOLUTE): 0.2 10*3/uL (ref 0.0–0.4)
Eos: 2 %
Hematocrit: 42 % (ref 34.0–46.6)
Hemoglobin: 14.3 g/dL (ref 11.1–15.9)
Immature Grans (Abs): 0.1 10*3/uL (ref 0.0–0.1)
Immature Granulocytes: 1 %
Lymphocytes Absolute: 2.1 10*3/uL (ref 0.7–3.1)
Lymphs: 29 %
MCH: 29.7 pg (ref 26.6–33.0)
MCHC: 34 g/dL (ref 31.5–35.7)
MCV: 87 fL (ref 79–97)
Monocytes Absolute: 0.5 10*3/uL (ref 0.1–0.9)
Monocytes: 6 %
Neutrophils Absolute: 4.4 10*3/uL (ref 1.4–7.0)
Neutrophils: 61 %
RBC: 4.82 x10E6/uL (ref 3.77–5.28)
RDW: 11.8 % (ref 11.7–15.4)
WBC: 7.3 10*3/uL (ref 3.4–10.8)

## 2020-01-31 LAB — COMPREHENSIVE METABOLIC PANEL
ALT: 14 IU/L (ref 0–32)
AST: 14 IU/L (ref 0–40)
Albumin/Globulin Ratio: 1.9 (ref 1.2–2.2)
Albumin: 4.3 g/dL (ref 3.8–4.9)
Alkaline Phosphatase: 37 IU/L — ABNORMAL LOW (ref 39–117)
BUN/Creatinine Ratio: 30 — ABNORMAL HIGH (ref 9–23)
BUN: 21 mg/dL (ref 6–24)
Bilirubin Total: <0.2 mg/dL (ref 0.0–1.2)
CO2: 28 mmol/L (ref 20–29)
Calcium: 10.1 mg/dL (ref 8.7–10.2)
Chloride: 99 mmol/L (ref 96–106)
Creatinine, Ser: 0.71 mg/dL (ref 0.57–1.00)
GFR calc Af Amer: 113 mL/min/{1.73_m2} (ref >59)
GFR calc non Af Amer: 98 mL/min/{1.73_m2} (ref >59)
Globulin, Total: 2.3 g/dL (ref 1.5–4.5)
Glucose: 89 mg/dL (ref 65–99)
Potassium: 4.1 mmol/L (ref 3.5–5.2)
Sodium: 140 mmol/L (ref 134–144)
Total Protein: 6.6 g/dL (ref 6.0–8.5)

## 2020-01-31 LAB — HEMOGLOBIN A1C
Est. average glucose Bld gHb Est-mCnc: 103 mg/dL
Hgb A1c MFr Bld: 5.2 % (ref 4.8–5.6)

## 2020-01-31 LAB — LIPID PANEL WITH LDL/HDL RATIO
Cholesterol, Total: 185 mg/dL (ref 100–199)
HDL: 32 mg/dL — ABNORMAL LOW (ref >39)
LDL Chol Calc (NIH): 96 mg/dL (ref 0–99)
LDL/HDL Ratio: 3 ratio (ref 0.0–3.2)
Triglycerides: 340 mg/dL — ABNORMAL HIGH (ref 0–149)
VLDL Cholesterol Cal: 57 mg/dL — ABNORMAL HIGH (ref 5–40)

## 2020-01-31 LAB — TSH: TSH: 3.24 u[IU]/mL (ref 0.450–4.500)

## 2020-02-03 ENCOUNTER — Other Ambulatory Visit: Payer: Self-pay

## 2020-02-03 ENCOUNTER — Ambulatory Visit: Payer: Self-pay | Admitting: Gerontology

## 2020-02-03 ENCOUNTER — Encounter: Payer: Self-pay | Admitting: Gerontology

## 2020-02-03 VITALS — BP 117/72 | HR 76 | Temp 98.1°F | Ht 64.0 in | Wt 184.7 lb

## 2020-02-03 DIAGNOSIS — Z0000 Encounter for general adult medical examination without abnormal findings: Secondary | ICD-10-CM | POA: Insufficient documentation

## 2020-02-03 DIAGNOSIS — K219 Gastro-esophageal reflux disease without esophagitis: Secondary | ICD-10-CM

## 2020-02-03 DIAGNOSIS — E781 Pure hyperglyceridemia: Secondary | ICD-10-CM

## 2020-02-03 DIAGNOSIS — Z720 Tobacco use: Secondary | ICD-10-CM

## 2020-02-03 DIAGNOSIS — I10 Essential (primary) hypertension: Secondary | ICD-10-CM

## 2020-02-03 DIAGNOSIS — E785 Hyperlipidemia, unspecified: Secondary | ICD-10-CM

## 2020-02-03 MED ORDER — LANSOPRAZOLE 30 MG PO CPDR: DELAYED_RELEASE_CAPSULE | ORAL | 4 refills | Status: AC

## 2020-02-03 MED ORDER — HYDROCHLOROTHIAZIDE 25 MG PO TABS: 25.0000 mg | ORAL_TABLET | Freq: Every day | ORAL | 4 refills | Status: AC

## 2020-02-03 MED ORDER — FENOFIBRATE 145 MG PO TABS: 145.0000 mg | ORAL_TABLET | Freq: Every day | ORAL | 4 refills | Status: DC

## 2020-02-03 MED ORDER — METOPROLOL TARTRATE 50 MG PO TABS: 50.0000 mg | ORAL_TABLET | Freq: Two times a day (BID) | ORAL | 4 refills | Status: AC
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
## 2020-02-03 NOTE — Patient Instructions (Signed)
Food Choices for Gastroesophageal Reflux Disease, Adult When you have gastroesophageal reflux disease (GERD), the foods you eat and your eating habits are very important. Choosing the right foods can help ease your discomfort. Think about working with a nutrition specialist (dietitian) to help you make good choices. What are tips for following this plan?  Meals  Choose healthy foods that are low in fat, such as fruits, vegetables, whole grains, low-fat dairy products, and lean meat, fish, and poultry.  Eat small meals often instead of 3 large meals a day. Eat your meals slowly, and in a place where you are relaxed. Avoid bending over or lying down until 2-3 hours after eating.  Avoid eating meals 2-3 hours before bed.  Avoid drinking a lot of liquid with meals.  Cook foods using methods other than frying. Bake, grill, or broil food instead.  Avoid or limit: ? Chocolate. ? Peppermint or spearmint. ? Alcohol. ? Pepper. ? Black and decaffeinated coffee. ? Black and decaffeinated tea. ? Bubbly (carbonated) soft drinks. ? Caffeinated energy drinks and soft drinks.  Limit high-fat foods such as: ? Fatty meat or fried foods. ? Whole milk, cream, butter, or ice cream. ? Nuts and nut butters. ? Pastries, donuts, and sweets made with butter or shortening.  Avoid foods that cause symptoms. These foods may be different for everyone. Common foods that cause symptoms include: ? Tomatoes. ? Oranges, lemons, and limes. ? Peppers. ? Spicy food. ? Onions and garlic. ? Vinegar. Lifestyle  Maintain a healthy weight. Ask your doctor what weight is healthy for you. If you need to lose weight, work with your doctor to do so safely.  Exercise for at least 30 minutes for 5 or more days each week, or as told by your doctor.  Wear loose-fitting clothes.  Do not smoke. If you need help quitting, ask your doctor.  Sleep with the head of your bed higher than your feet. Use a wedge under the  mattress or blocks under the bed frame to raise the head of the bed. Summary  When you have gastroesophageal reflux disease (GERD), food and lifestyle choices are very important in easing your symptoms.  Eat small meals often instead of 3 large meals a day. Eat your meals slowly, and in a place where you are relaxed.  Limit high-fat foods such as fatty meat or fried foods.  Avoid bending over or lying down until 2-3 hours after eating.  Avoid peppermint and spearmint, caffeine, alcohol, and chocolate. This information is not intended to replace advice given to you by your health care provider. Make sure you discuss any questions you have with your health care provider. Document Revised: 02/06/2019 Document Reviewed: 11/21/2016 Elsevier Patient Education  2020 [place] Your Hypertension Hypertension is commonly called high blood pressure. This is when the force of your blood pressing against the walls of your arteries is too strong. Arteries are blood vessels that carry blood from your heart throughout your body. Hypertension forces the heart to work harder to pump blood, and may cause the arteries to become narrow or stiff. Having untreated or uncontrolled hypertension can cause heart attack, stroke, kidney disease, and other problems. What are blood pressure readings? A blood pressure reading consists of a higher number over a lower number. Ideally, your blood pressure should be below 120/80. The first ("top") number is called the systolic pressure. It is a measure of the pressure in your arteries as your heart beats. The second ("bottom") number  is called the diastolic pressure. It is a measure of the pressure in your arteries as the heart relaxes. What does my blood pressure reading mean? Blood pressure is classified into four stages. Based on your blood pressure reading, your health care provider may use the following stages to determine what type of treatment you need, if  any. Systolic pressure and diastolic pressure are measured in a unit called mm Hg. Normal  Systolic pressure: below 093.  Diastolic pressure: below 80. Elevated  Systolic pressure: 818-299.  Diastolic pressure: below 80. Hypertension stage 1  Systolic pressure: 371-696.  Diastolic pressure: 78-93. Hypertension stage 2  Systolic pressure: 810 or above.  Diastolic pressure: 90 or above. What health risks are associated with hypertension? Managing your hypertension is an important responsibility. Uncontrolled hypertension can lead to:  A heart attack.  A stroke.  A weakened blood vessel (aneurysm).  Heart failure.  Kidney damage.  Eye damage.  Metabolic syndrome.  Memory and concentration problems. What changes can I make to manage my hypertension? Hypertension can be managed by making lifestyle changes and possibly by taking medicines. Your health care provider will help you make a plan to bring your blood pressure within a normal range. Eating and drinking   Eat a diet that is high in fiber and potassium, and low in salt (sodium), added sugar, and fat. An example eating plan is called the DASH (Dietary Approaches to Stop Hypertension) diet. To eat this way: ? Eat plenty of fresh fruits and vegetables. Try to fill half of your plate at each meal with fruits and vegetables. ? Eat whole grains, such as whole wheat pasta, brown rice, or whole grain bread. Fill about one quarter of your plate with whole grains. ? Eat low-fat diary products. ? Avoid fatty cuts of meat, processed or cured meats, and poultry with skin. Fill about one quarter of your plate with lean proteins such as fish, chicken without skin, beans, eggs, and tofu. ? Avoid premade and processed foods. These tend to be higher in sodium, added sugar, and fat.  Reduce your daily sodium intake. Most people with hypertension should eat less than 1,500 mg of sodium a day.  Limit alcohol intake to no more than 1  drink a day for nonpregnant women and 2 drinks a day for men. One drink equals 12 oz of beer, 5 oz of wine, or 1 oz of hard liquor. Lifestyle  Work with your health care provider to maintain a healthy body weight, or to lose weight. Ask what an ideal weight is for you.  Get at least 30 minutes of exercise that causes your heart to beat faster (aerobic exercise) most days of the week. Activities may include walking, swimming, or biking.  Include exercise to strengthen your muscles (resistance exercise), such as weight lifting, as part of your weekly exercise routine. Try to do these types of exercises for 30 minutes at least 3 days a week.  Do not use any products that contain nicotine or tobacco, such as cigarettes and e-cigarettes. If you need help quitting, ask your health care provider.  Control any long-term (chronic) conditions you have, such as high cholesterol or diabetes. Monitoring  Monitor your blood pressure at home as told by your health care provider. Your personal target blood pressure may vary depending on your medical conditions, your age, and other factors.  Have your blood pressure checked regularly, as often as told by your health care provider. Working with your health care provider  Review all the medicines you take with your health care provider because there may be side effects or interactions.  Talk with your health care provider about your diet, exercise habits, and other lifestyle factors that may be contributing to hypertension.  Visit your health care provider regularly. Your health care provider can help you create and adjust your plan for managing hypertension. Will I need medicine to control my blood pressure? Your health care provider may prescribe medicine if lifestyle changes are not enough to get your blood pressure under control, and if:  Your systolic blood pressure is 130 or higher.  Your diastolic blood pressure is 80 or higher. Take medicines  only as told by your health care provider. Follow the directions carefully. Blood pressure medicines must be taken as prescribed. The medicine does not work as well when you skip doses. Skipping doses also puts you at risk for problems. Contact a health care provider if:  You think you are having a reaction to medicines you have taken.  You have repeated (recurrent) headaches.  You feel dizzy.  You have swelling in your ankles.  You have trouble with your vision. Get help right away if:  You develop a severe headache or confusion.  You have unusual weakness or numbness, or you feel faint.  You have severe pain in your chest or abdomen.  You vomit repeatedly.  You have trouble breathing. Summary  Hypertension is when the force of blood pumping through your arteries is too strong. If this condition is not controlled, it may put you at risk for serious complications.  Your personal target blood pressure may vary depending on your medical conditions, your age, and other factors. For most people, a normal blood pressure is less than 120/80.  Hypertension is managed by lifestyle changes, medicines, or both. Lifestyle changes include weight loss, eating a healthy, low-sodium diet, exercising more, and limiting alcohol. This information is not intended to replace advice given to you by your health care provider. Make sure you discuss any questions you have with your health care provider. Document Revised: 02/07/2019 Document Reviewed: 09/13/2016 Elsevier Patient Education  2020 [employer].

## 2020-02-04 ENCOUNTER — Telehealth: Payer: Self-pay | Admitting: Pharmacist

## 2020-02-04 NOTE — Telephone Encounter (Signed)
02/04/2020 2:12:27 PM - Cymbalta pending  -- Monica Moody - Wednesday, February 04, 2020 2:10 PM --Received provider signed portion of [employer] application for Cymbalta--holding for patient to return her portion-01/29/2020 put in bag with patient's meds on the wall--patient HAS NOT PICKED UP MEDS YET.

## 2020-02-10 ENCOUNTER — Other Ambulatory Visit: Payer: Self-pay

## 2020-02-10 ENCOUNTER — Ambulatory Visit: Payer: Self-pay | Admitting: Licensed Clinical Social Worker

## 2020-02-10 DIAGNOSIS — F331 Major depressive disorder, recurrent, moderate: Secondary | ICD-10-CM

## 2020-02-10 DIAGNOSIS — F411 Generalized anxiety disorder: Secondary | ICD-10-CM

## 2020-02-10 NOTE — BH Specialist Note (Signed)
Integrated Behavioral Health Follow Up Visit Via Phone  MRN: [id number] Name: Monica Moody  Type of Service: [place] Interpretor:No. Interpretor Name and Language: not applicable.   SUBJECTIVE: Monica Moody is a 54 y.o. female accompanied by herself. Patient was referred by Carlyon Shadow NP for mental health. Patient reports the following symptoms/concerns: She notes that she has been keeping busy doing yard work at her mom's and has yet to do anything inside of her house to de-clutter. 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 She explains that she is planning to met with someone at [facility] to update her resume. 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 She notes that her sleep has not been the best in the last few nights. She denies suicidal and homicidal thoughts.  Duration of problem: ; Severity of problem: moderate  OBJECTIVE: Mood: Euthymic and Affect: Appropriate Risk of harm to self or others: No plan to harm self or others  LIFE CONTEXT: Family and Social: see above. School/Work: see above. Self-Care: see above. Life Changes: see above.   GOALS ADDRESSED: Patient will: 1.  Reduce symptoms of: anxiety  2.  Increase knowledge and/or ability of: self-management skills and stress reduction  3.  Demonstrate ability to: Increase healthy adjustment to current life circumstances  INTERVENTIONS: Interventions utilized:  Supportive Counseling was utilized by the clinician during today's follow up session. Clinician processed with the patient regarding how she has been doing since the last follow up session. Clinician measured the patient's anxiety and depression on a numerical scale. Clinician explained to the patient that it sounds like she has taken steps towards taking some courses to finding a job and starting a  career that is suited to her. Clinician encouraged the patient to focus on distractions rather than focusing on things that are out of her control.  Standardized Assessments completed: GAD-7 and PHQ 9  ASSESSMENT: Patient currently experiencing see above.   Patient may benefit from see above.  PLAN: 1. Follow up with behavioral health clinician on : two weeks or earlier if needed.  2. Behavioral recommendations: see above. 3. Referral(s): [facility] (In Clinic) 4. "From scale of 1-10, how likely are you to follow plan?":   Bayard Hugger, LCSW

## 2020-02-24 ENCOUNTER — Other Ambulatory Visit: Payer: Self-pay

## 2020-02-24 ENCOUNTER — Ambulatory Visit: Payer: Self-pay | Admitting: Licensed Clinical Social Worker

## 2020-02-24 DIAGNOSIS — F411 Generalized anxiety disorder: Secondary | ICD-10-CM

## 2020-02-24 DIAGNOSIS — F331 Major depressive disorder, recurrent, moderate: Secondary | ICD-10-CM

## 2020-02-24 NOTE — BH Specialist Note (Signed)
Integrated Behavioral Health Follow Up Visit Via Phone  MRN: [id number] Name: Monica Moody  Type of Service: Integrated Behavioral Health- Individual/Family Interpretor:No. Interpretor Name and Language: not applicable.   SUBJECTIVE: Monica Moody is a 54 y.o. female accompanied by herself. Patient was referred by Hurman Horn NP for mental health. Patient reports the following symptoms/concerns: She notes that she has not been sleeping too good and feels restless at night. She notes that this has been occurring for the last couple of weeks since she quit working but keeps busy with different projects at her [facility]. She explains that she does not like going to bed and just feels uncomfortable. She reports that she looked at a EKG course at [facility] that is a hybrid program that starts in August. She notes that she wants to do a phlebotomy course after she completes the EKG program. She discussed dealing with pain in her back and is thinking of making an appointment to discuss it at the clinic. She denies suicidal and homicidal thoughts.  Duration of problem:; Severity of problem: mild  OBJECTIVE: Mood: Euthymic and Affect: Appropriate Risk of harm to self or others: No plan to harm self or others  LIFE CONTEXT: Family and Social: see above.  School/Work: see above. Self-Care: see above. Life Changes: see above.   GOALS ADDRESSED: Patient will: 1.  Reduce symptoms of: insomnia  2.  Increase knowledge and/or ability of: coping skills and self-management skills  3.  Demonstrate ability to: Increase healthy adjustment to current life circumstances  INTERVENTIONS: Interventions utilized:  Solution-Focused Strategies was utilized by the clinician during today's follow up session. Clinician processed with the patient regarding how she has been doing since the last follow up session. Clinician measured the patient's symptoms of anxiety and depression on a numerical scale. Clinician  discussed the concept of sleep hygiene with the patient ; ie going to bed and waking up around the same time each day, avoid use of electronics while in bed, and do not do anything in bed other than sleep. Clinician suggested that when the patient is unable to sleep to leave her bed, and go in another room to do some type of activity for at least fifteen to thirty minutes. Clinician explained that these kinds of activities can be reading, completing a word search or cross word puzzle, or watching TV then after the elapsed time going back to her room to try to sleep.  Standardized Assessments completed: GAD-7 and PHQ 9  ASSESSMENT: Patient currently experiencing see above.   Patient may benefit from see above.  PLAN: 1. Follow up with behavioral health clinician on :  2. Behavioral recommendations: see above.  3. Referral(s): [facility] (In Clinic) 4. "From scale of 1-10, how likely are you to follow plan?":   Althia Forts, LCSW

## 2020-03-09 ENCOUNTER — Ambulatory Visit: Payer: Self-pay | Admitting: Licensed Clinical Social Worker

## 2020-03-09 ENCOUNTER — Other Ambulatory Visit: Payer: Self-pay

## 2020-03-09 DIAGNOSIS — F411 Generalized anxiety disorder: Secondary | ICD-10-CM

## 2020-03-09 DIAGNOSIS — F331 Major depressive disorder, recurrent, moderate: Secondary | ICD-10-CM

## 2020-03-09 NOTE — BH Specialist Note (Signed)
Integrated Behavioral Health Follow Up Visit Via Phone  MRN: [id number] Name: Monica Moody  Type of Service: Integrated Behavioral Health- Individual/Family Interpretor:No. Interpretor Name and Language: not applicable.   SUBJECTIVE: Monica Moody is a 54 y.o. female accompanied by herself. Patient was referred by Hurman Horn NP for mental health. Patient reports the following symptoms/concerns: She notes that she went to the beach a week ago with her daughter, her husband, and family. She notes that she had fun with her family at the beach. She discussed a few situational stressors with her husband and her family. She notes that she had a good time with her mom on mother's day. She notes that she is looking forward to going to [place] next month to spend time with her friends. She notes that she is thinking she is going to stick to her plan of taking phelebotomy and EKG courses at [facility]. She denies suicidal and homicidal thoughts.  Duration of problem: ; Severity of problem: mild  OBJECTIVE: Mood: Euthymic and Affect: Appropriate Risk of harm to self or others: No plan to harm self or others  LIFE CONTEXT: Family and Social: see above. School/Work: see above. Self-Care: see above.  Life Changes:see above.  GOALS ADDRESSED: Patient will: 1.  Reduce symptoms of: stress 2.  Increase knowledge and/or ability of: coping skills  3.  Demonstrate ability to: Increase healthy adjustment to current life circumstances  INTERVENTIONS: Interventions utilized:  Supportive Counseling was utilized by the clinician during today's follow up session. Clinician processed with the patient regarding how she has been doing since the last follow up session. Clinician measured the patient's anxiety and depression on a numerical scale. Clinician suggested that the patient focus on what is in her control, her own thoughts, feelings, and actions to situations. Clinician explained to the patient  that she cannot control anyone else. Clinician encouraged the patient to continue to utilize her coping skills.  Standardized Assessments completed: GAD-7 and PHQ 9  ASSESSMENT: Patient currently experiencing see above.   Patient may benefit from see above.  PLAN: 1. Follow up with behavioral health clinician on :  2. Behavioral recommendations: see above.  3. Referral(s): [facility] (In Clinic) 4. "From scale of 1-10, how likely are you to follow plan?":   Althia Forts, LCSW

## 2020-03-10 ENCOUNTER — Telehealth: Payer: Self-pay | Admitting: Gerontology

## 2020-03-10 NOTE — Telephone Encounter (Signed)
Pt. Was notified on 5/12 that due to her income she is no longer eligible for service at [facility]. She was notified that we will be able to see her for the next 30 days but will have to find a new provider after. She was made a final appointment within her 30 days and will be given contact information for other clinics to transition to

## 2020-03-25 ENCOUNTER — Other Ambulatory Visit: Payer: Self-pay

## 2020-03-25 ENCOUNTER — Ambulatory Visit: Payer: Self-pay | Admitting: Licensed Clinical Social Worker

## 2020-04-01 ENCOUNTER — Ambulatory Visit: Payer: Self-pay | Admitting: Gerontology

## 2020-04-06 ENCOUNTER — Ambulatory Visit: Payer: Self-pay | Admitting: Gerontology

## 2020-04-06 ENCOUNTER — Other Ambulatory Visit: Payer: Self-pay

## 2020-04-06 DIAGNOSIS — I10 Essential (primary) hypertension: Secondary | ICD-10-CM

## 2020-04-06 DIAGNOSIS — L299 Pruritus, unspecified: Secondary | ICD-10-CM | POA: Insufficient documentation

## 2020-04-06 MED ORDER — TRIAMCINOLONE ACETONIDE 0.1 % EX CREA: 1.0000 "application " | TOPICAL_CREAM | Freq: Two times a day (BID) | CUTANEOUS | 0 refills | Status: AC
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
## 2020-04-06 NOTE — Patient Instructions (Signed)
DASH Eating Plan DASH stands for "Dietary Approaches to Stop Hypertension." The DASH eating plan is a healthy eating plan that has been shown to reduce high blood pressure (hypertension). It may also reduce your risk for type 2 diabetes, heart disease, and stroke. The DASH eating plan may also help with weight loss. What are tips for following this plan?  General guidelines  Avoid eating more than 2,300 mg (milligrams) of salt (sodium) a day. If you have hypertension, you may need to reduce your sodium intake to 1,500 mg a day.  Limit alcohol intake to no more than 1 drink a day for nonpregnant women and 2 drinks a day for men. One drink equals 12 oz of beer, 5 oz of wine, or 1 oz of hard liquor.  Work with your health care provider to maintain a healthy body weight or to lose weight. Ask what an ideal weight is for you.  Get at least 30 minutes of exercise that causes your heart to beat faster (aerobic exercise) most days of the week. Activities may include walking, swimming, or biking.  Work with your health care provider or diet and nutrition specialist (dietitian) to adjust your eating plan to your individual calorie needs. Reading food labels   Check food labels for the amount of sodium per serving. Choose foods with less than 5 percent of the Daily Value of sodium. Generally, foods with less than 300 mg of sodium per serving fit into this eating plan.  To find whole grains, look for the word "whole" as the first word in the ingredient list. Shopping  Buy products labeled as "low-sodium" or "no salt added."  Buy fresh foods. Avoid canned foods and premade or frozen meals. Cooking  Avoid adding salt when cooking. Use salt-free seasonings or herbs instead of table salt or sea salt. Check with your health care provider or pharmacist before using salt substitutes.  Do not fry foods. Cook foods using healthy methods such as baking, boiling, grilling, and broiling instead.  Cook with  heart-healthy oils, such as olive, canola, soybean, or sunflower oil. Meal planning  Eat a balanced diet that includes: ? 5 or more servings of fruits and vegetables each day. At each meal, try to fill half of your plate with fruits and vegetables. ? Up to 6-8 servings of whole grains each day. ? Less than 6 oz of lean meat, poultry, or fish each day. A 3-oz serving of meat is about the same size as a deck of cards. One egg equals 1 oz. ? 2 servings of low-fat dairy each day. ? A serving of nuts, seeds, or beans 5 times each week. ? Heart-healthy fats. Healthy fats called Omega-3 fatty acids are found in foods such as flaxseeds and coldwater fish, like sardines, salmon, and mackerel.  Limit how much you eat of the following: ? Canned or prepackaged foods. ? Food that is high in trans fat, such as fried foods. ? Food that is high in saturated fat, such as fatty meat. ? Sweets, desserts, sugary drinks, and other foods with added sugar. ? Full-fat dairy products.  Do not salt foods before eating.  Try to eat at least 2 vegetarian meals each week.  Eat more home-cooked food and less restaurant, buffet, and fast food.  When eating at a restaurant, ask that your food be prepared with less salt or no salt, if possible. What foods are recommended? The items listed may not be a complete list. Talk with your dietitian about   what dietary choices are best for you. Grains Whole-grain or whole-wheat bread. Whole-grain or whole-wheat pasta. Brown rice. Oatmeal. Quinoa. Bulgur. Whole-grain and low-sodium cereals. Pita bread. Low-fat, low-sodium crackers. Whole-wheat flour tortillas. Vegetables Fresh or frozen vegetables (raw, steamed, roasted, or grilled). Low-sodium or reduced-sodium tomato and vegetable juice. Low-sodium or reduced-sodium tomato sauce and tomato paste. Low-sodium or reduced-sodium canned vegetables. Fruits All fresh, dried, or frozen fruit. Canned fruit in natural juice (without  added sugar). Meat and other protein foods Skinless chicken or turkey. Ground chicken or turkey. Pork with fat trimmed off. Fish and seafood. Egg whites. Dried beans, peas, or lentils. Unsalted nuts, nut butters, and seeds. Unsalted canned beans. Lean cuts of beef with fat trimmed off. Low-sodium, lean deli meat. Dairy Low-fat (1%) or fat-free (skim) milk. Fat-free, low-fat, or reduced-fat cheeses. Nonfat, low-sodium ricotta or cottage cheese. Low-fat or nonfat yogurt. Low-fat, low-sodium cheese. Fats and oils Soft margarine without trans fats. Vegetable oil. Low-fat, reduced-fat, or light mayonnaise and salad dressings (reduced-sodium). Canola, safflower, olive, soybean, and sunflower oils. Avocado. Seasoning and other foods Herbs. Spices. Seasoning mixes without salt. Unsalted popcorn and pretzels. Fat-free sweets. What foods are not recommended? The items listed may not be a complete list. Talk with your dietitian about what dietary choices are best for you. Grains Baked goods made with fat, such as croissants, muffins, or some breads. Dry pasta or rice meal packs. Vegetables Creamed or fried vegetables. Vegetables in a cheese sauce. Regular canned vegetables (not low-sodium or reduced-sodium). Regular canned tomato sauce and paste (not low-sodium or reduced-sodium). Regular tomato and vegetable juice (not low-sodium or reduced-sodium). Pickles. Olives. Fruits Canned fruit in a light or heavy syrup. Fried fruit. Fruit in cream or butter sauce. Meat and other protein foods Fatty cuts of meat. Ribs. Fried meat. Bacon. Sausage. Bologna and other processed lunch meats. Salami. Fatback. Hotdogs. Bratwurst. Salted nuts and seeds. Canned beans with added salt. Canned or smoked fish. Whole eggs or egg yolks. Chicken or turkey with skin. Dairy Whole or 2% milk, cream, and half-and-half. Whole or full-fat cream cheese. Whole-fat or sweetened yogurt. Full-fat cheese. Nondairy creamers. Whipped toppings.  Processed cheese and cheese spreads. Fats and oils Butter. Stick margarine. Lard. Shortening. Ghee. Bacon fat. Tropical oils, such as coconut, palm kernel, or palm oil. Seasoning and other foods Salted popcorn and pretzels. Onion salt, garlic salt, seasoned salt, table salt, and sea salt. Worcestershire sauce. Tartar sauce. Barbecue sauce. Teriyaki sauce. Soy sauce, including reduced-sodium. Steak sauce. Canned and packaged gravies. Fish sauce. Oyster sauce. Cocktail sauce. Horseradish that you find on the shelf. Ketchup. Mustard. Meat flavorings and tenderizers. Bouillon cubes. Hot sauce and Tabasco sauce. Premade or packaged marinades. Premade or packaged taco seasonings. Relishes. Regular salad dressings. Where to find more information:  National Heart, Lung, and Blood Institute: www.nhlbi.nih.gov  American Heart Association: www.heart.org Summary  The DASH eating plan is a healthy eating plan that has been shown to reduce high blood pressure (hypertension). It may also reduce your risk for type 2 diabetes, heart disease, and stroke.  With the DASH eating plan, you should limit salt (sodium) intake to 2,300 mg a day. If you have hypertension, you may need to reduce your sodium intake to 1,500 mg a day.  When on the DASH eating plan, aim to eat more fresh fruits and vegetables, whole grains, lean proteins, low-fat dairy, and heart-healthy fats.  Work with your health care provider or diet and nutrition specialist (dietitian) to adjust your eating plan to your   individual calorie needs. This information is not intended to replace advice given to you by your health care provider. Make sure you discuss any questions you have with your health care provider. Document Revised: 09/28/2017 Document Reviewed: 10/09/2016 Elsevier Patient Education  2020 [employer]  

## 2020-04-07 ENCOUNTER — Other Ambulatory Visit: Payer: Self-pay

## 2020-04-07 DIAGNOSIS — L299 Pruritus, unspecified: Secondary | ICD-10-CM

## 2020-04-08 ENCOUNTER — Other Ambulatory Visit: Payer: Self-pay

## 2020-04-08 ENCOUNTER — Ambulatory Visit: Payer: Self-pay | Admitting: Family Medicine

## 2020-04-08 VITALS — BP 129/70 | HR 83 | Temp 98.3°F | Resp 16 | Ht 63.0 in | Wt 187.9 lb

## 2020-04-08 DIAGNOSIS — L299 Pruritus, unspecified: Secondary | ICD-10-CM

## 2020-04-08 LAB — COMPREHENSIVE METABOLIC PANEL
ALT: 24 IU/L (ref 0–32)
AST: 23 IU/L (ref 0–40)
Albumin/Globulin Ratio: 2.1 (ref 1.2–2.2)
Albumin: 4.6 g/dL (ref 3.8–4.9)
Alkaline Phosphatase: 42 IU/L — ABNORMAL LOW (ref 48–121)
BUN/Creatinine Ratio: 28 — ABNORMAL HIGH (ref 9–23)
BUN: 22 mg/dL (ref 6–24)
Bilirubin Total: 0.2 mg/dL (ref 0.0–1.2)
CO2: 25 mmol/L (ref 20–29)
Calcium: 9.4 mg/dL (ref 8.7–10.2)
Chloride: 100 mmol/L (ref 96–106)
Creatinine, Ser: 0.78 mg/dL (ref 0.57–1.00)
GFR calc Af Amer: 101 mL/min/{1.73_m2} (ref >59)
GFR calc non Af Amer: 88 mL/min/{1.73_m2} (ref >59)
Globulin, Total: 2.2 g/dL (ref 1.5–4.5)
Glucose: 92 mg/dL (ref 65–99)
Potassium: 4.2 mmol/L (ref 3.5–5.2)
Sodium: 138 mmol/L (ref 134–144)
Total Protein: 6.8 g/dL (ref 6.0–8.5)

## 2020-04-08 LAB — CBC WITH DIFFERENTIAL/PLATELET
Basophils Absolute: 0 10*3/uL (ref 0.0–0.2)
Basos: 1 %
EOS (ABSOLUTE): 0.1 10*3/uL (ref 0.0–0.4)
Eos: 1 %
Hematocrit: 40.7 % (ref 34.0–46.6)
Hemoglobin: 13.8 g/dL (ref 11.1–15.9)
Immature Grans (Abs): 0 10*3/uL (ref 0.0–0.1)
Immature Granulocytes: 1 %
Lymphocytes Absolute: 1.4 10*3/uL (ref 0.7–3.1)
Lymphs: 27 %
MCH: 29.2 pg (ref 26.6–33.0)
MCHC: 33.9 g/dL (ref 31.5–35.7)
MCV: 86 fL (ref 79–97)
Monocytes Absolute: 0.5 10*3/uL (ref 0.1–0.9)
Monocytes: 9 %
Neutrophils Absolute: 3.2 10*3/uL (ref 1.4–7.0)
Neutrophils: 61 %
Platelets: 262 10*3/uL (ref 150–450)
RBC: 4.73 x10E6/uL (ref 3.77–5.28)
RDW: 12.5 % (ref 11.7–15.4)
WBC: 5.3 10*3/uL (ref 3.4–10.8)

## 2020-04-08 LAB — TSH: TSH: 1.95 u[IU]/mL (ref 0.450–4.500)

## 2020-04-08 MED ORDER — HYDROXYZINE HCL 25 MG PO TABS: 25.0000 mg | ORAL_TABLET | Freq: Three times a day (TID) | ORAL | 0 refills | Status: AC | PRN

## 2020-04-08 NOTE — Progress Notes (Signed)
   Progress Note: Sick Visit Provider: Mike Gip, FNP  SUBJECTIVE:   Monica Moody is a 54 y.o. female who  has a past medical history of Abscess of chest wall, Anxiety, Complication of anesthesia, Depression, GERD (gastroesophageal reflux disease), Headache, Hyperlipidemia, Hypertension, IFG (impaired fasting glucose), Kidney stone, Obesity, Palpitations, and Tobacco use.. Patient presents today for full body itching  Patient presents today with itching. She reports that she did not start the triamcinolone cream that was prescribed due to not picking it up until this afternoon. Patient denies any new medications, soap, or detergents. No recent sunburn. Benadryl provided minimal relief. Denies sore throat or swollen glands. + eye itching.  Review of Systems  HENT: Negative for sore throat.   Eyes: Positive for discharge.  Respiratory: Negative for cough.   Skin: Positive for itching. Negative for rash.  Neurological: Negative.      OBJECTIVE: BP 129/70 (BP Location: Left Arm, Patient Position: Sitting, Cuff Size: Normal)   Pulse 83   Temp 98.3 F (36.8 C) (Oral)   Resp 16   Ht 5\' 3"  (1.6 m)   Wt 187 lb 14.4 oz (85.2 kg)   SpO2 93%   BMI 33.28 kg/m   Wt Readings from Last 3 Encounters:  04/08/20 187 lb 14.4 oz (85.2 kg)  02/03/20 184 lb 11.2 oz (83.8 kg)  01/29/20 186 lb 1.6 oz (84.4 kg)     Physical Exam Constitutional:      Appearance: Normal appearance.  HENT:     Head: Normocephalic and atraumatic.     Mouth/Throat:     Mouth: Mucous membranes are dry.  Skin:    General: Skin is warm and dry.     Findings: No erythema or rash.  Neurological:     Mental Status: She is alert and oriented to person, place, and time.  Psychiatric:        Mood and Affect: Mood normal.        Behavior: Behavior normal.     ASSESSMENT/PLAN:   1. Pruritus No recent medication changes. Patient with idiopathic pruritus. Will do trial of hydroxyzine for itching. rtc in 2 weeks if  not resolved.  - hydrOXYzine (ATARAX/VISTARIL) 25 MG tablet; Take 1 tablet (25 mg total) by mouth every 8 (eight) hours as needed for up to 14 days for itching.  Dispense: 30 tablet; Refill: 0        The patient was given clear instructions to go to ER or return to medical center if symptoms do not improve, worsen or new problems develop. The patient verbalized understanding and agreed with plan of care.   Ms. 03/30/20. Freda Jackson, FNP-BC

## 2020-04-08 NOTE — Patient Instructions (Signed)
Pruritus Pruritus is an itchy feeling on the skin. One of the most common causes is dry skin, but many different things can cause itching. Most cases of itching do not require medical attention. Sometimes itchy skin can turn into a rash. Follow these instructions at home: Skin care   Apply moisturizing lotion to your skin as needed. Lotion that contains petroleum jelly is best.  Take medicines or apply medicated creams only as told by your health care provider. This may include: ? Corticosteroid cream. ? Anti-itch lotions. ? Oral antihistamines.  Apply a cool, wet cloth (cool compress) to the affected areas.  Take baths with one of the following: ? Epsom salts. You can get these at your local pharmacy or grocery store. Follow the instructions on the packaging. ? Baking soda. Pour a small amount into the bath as told by your health care provider. ? Colloidal oatmeal. You can get this at your local pharmacy or grocery store. Follow the instructions on the packaging.  Apply baking soda paste to your skin. To make the paste, stir water into a small amount of baking soda until it reaches a paste-like consistency.  Do not scratch your skin.  Do not take hot showers or baths, which can make itching worse. A cool shower may help with itching as long as you apply moisturizing lotion after the shower.  Do not use scented soaps, detergents, perfumes, and cosmetic products. Instead, use gentle, unscented versions of these items. General instructions  Avoid wearing tight clothes.  Keep a journal to help find out what is causing your itching. Write down: ? What you eat and drink. ? What cosmetic products you use. ? What soaps or detergents you use. ? What you wear, including jewelry.  Use a humidifier. This keeps the air moist, which helps to prevent dry skin.  Be aware of any changes in your itchiness. Contact a health care provider if:  The itching does not go away after several  days.  You are unusually thirsty or urinating more than normal.  Your skin tingles or feels numb.  Your skin or the white parts of your eyes turn yellow (jaundice).  You feel weak.  You have any of the following: ? Night sweats. ? Tiredness (fatigue). ? Weight loss. ? Abdominal pain. Summary  Pruritus is an itchy feeling on the skin. One of the most common causes is dry skin, but many different conditions and factors can cause itching.  Apply moisturizing lotion to your skin as needed. Lotion that contains petroleum jelly is best.  Take medicines or apply medicated creams only as told by your health care provider.  Do not take hot showers or baths. Do not use scented soaps, detergents, perfumes, or cosmetic products. This information is not intended to replace advice given to you by your health care provider. Make sure you discuss any questions you have with your health care provider. Document Revised: 10/30/2017 Document Reviewed: 10/30/2017 Elsevier Patient Education  2020 [employer]vier Inc[employer]  

## 2020-04-22 ENCOUNTER — Ambulatory Visit: Payer: Self-pay | Admitting: Family Medicine

## 2020-04-22 ENCOUNTER — Other Ambulatory Visit: Payer: Self-pay

## 2020-04-22 DIAGNOSIS — H1013 Acute atopic conjunctivitis, bilateral: Secondary | ICD-10-CM

## 2020-04-22 DIAGNOSIS — H1010 Acute atopic conjunctivitis, unspecified eye: Secondary | ICD-10-CM | POA: Insufficient documentation

## 2020-04-22 DIAGNOSIS — Z720 Tobacco use: Secondary | ICD-10-CM

## 2020-04-22 DIAGNOSIS — L299 Pruritus, unspecified: Secondary | ICD-10-CM

## 2020-04-22 DIAGNOSIS — Z0000 Encounter for general adult medical examination without abnormal findings: Secondary | ICD-10-CM

## 2020-04-22 NOTE — Progress Notes (Signed)
Subjective:    Monica Moody is Monica Moody 54 y.o. female. With hx HTN, GERD, tobacco abuse  Here today for blood work and follow up No concerns in particular.   Had to switch the antihistamine she was on - hydroxyzine making her sleeping - itching better, still in her eyes - was itching all over - no rash - was wondering about cats - using allegra now, which helps  Doesn't need any refills she knows of   Smoking - ? Quitting.  She's not ready yet.   The following portions of the patient's history were reviewed and updated as appropriate: allergies, current medications, past family history, past medical history, past social history, past surgical history and problem list.  Review of Systems Pertinent items are noted in HPI.    Objective:    BP 110/77   Pulse 83   Wt 189 lb (85.7 kg)   SpO2 96%   BMI 33.48 kg/m   General Appearance:    Alert, cooperative, no distress, appears stated age  Head:    Normocephalic, without obvious abnormality, atraumatic  Eyes:    Slightly swollen eyelids     Nose:   Nares normal, septum midline  Throat:   Lips, mucosa, and tongue normal; teeth and gums normal  Neck:   Supple, symmetrical, trachea midline     Lungs:     Clear to auscultation bilaterally, respirations unlabored  Chest Wall:    No tenderness or deformity   Heart:    Regular rate and rhythm, S1 and S2 normal, no murmur, rub   or gallop     Abdomen:     Soft, non-tender, non distended        Extremities:   Extremities normal, atraumatic, no cyanosis or edema     Skin:   Skin color, texture, turgor normal, no rashes or lesions     Neurologic:   CNII-XII grossly intact, moving all extremities      Assessment and Plan   Monica Moody was seen today for follow-up.  Diagnoses and all orders for this visit:  Allergic conjunctivitis of both eyes  Pruritus  Tobacco use  Healthcare maintenance   Problem List Items Addressed This Visit      Musculoskeletal and Integument   Pruritus     Continue allegra - unclear etiology, though sounds like maybe her cats         Other   Tobacco use    She's willing to consider quitting smoking But not interested yet, wants to keep thinking, not interested in meds at this time Follow in 1 month to discuss      Healthcare maintenance    Adult vaccines due  Topic Date Due  . TETANUS/TDAP  03/24/2030   Health Maintenance Due  Topic Date Due  . Hepatitis C Screening  Never done  . COVID-19 Vaccine (1) Never done  . HIV Screening  Never done  . MAMMOGRAM  02/08/2019  . PAP SMEAR-Modifier  02/08/2020  Colonoscopy earlier this year, due for repeat in 5 years She's due for Pap smear -> 2018 with ascus, HPV negative -> needs repeat cotesting - follow up to discuss at next visit as well as mammogram.  Discussed need for follow up.      Allergic conjunctivitis    Discussed not rubbing eyes, cold compress, artificial tears Could use vasoconstrictor/antihistamine combo if sx not improving, but advised short term use only Continue antihistamine

## 2020-04-22 NOTE — Assessment & Plan Note (Signed)
Discussed not rubbing eyes, cold compress, artificial tears Could use vasoconstrictor/antihistamine combo if sx not improving, but advised short term use only Continue antihistamine

## 2020-04-22 NOTE — Patient Instructions (Signed)
You labs look good.  Try artificial tears to help with your allergic conjunctivitis (itchy eyes).    Smoking cessation is extremely important for your health.  Please follow up in 1 month to discuss this further.

## 2020-04-22 NOTE — Assessment & Plan Note (Addendum)
Adult vaccines due  Topic Date Due  . TETANUS/TDAP  03/24/2030   Health Maintenance Due  Topic Date Due  . Hepatitis C Screening  Never done  . COVID-19 Vaccine (1) Never done  . HIV Screening  Never done  . MAMMOGRAM  02/08/2019  . PAP SMEAR-Modifier  02/08/2020  Colonoscopy earlier this year, due for repeat in 5 years She's due for Pap smear -> 2018 with ascus, HPV negative -> needs repeat cotesting - follow up to discuss at next visit as well as mammogram.  Discussed need for follow up.

## 2020-04-22 NOTE — Assessment & Plan Note (Signed)
Continue allegra - unclear etiology, though sounds like maybe her cats

## 2020-04-22 NOTE — Assessment & Plan Note (Signed)
She's willing to consider quitting smoking But not interested yet, wants to keep thinking, not interested in meds at this time Follow in 1 month to discuss

## 2020-04-29 ENCOUNTER — Other Ambulatory Visit: Payer: Self-pay

## 2020-04-29 DIAGNOSIS — F331 Major depressive disorder, recurrent, moderate: Secondary | ICD-10-CM

## 2020-04-29 MED ORDER — LAMOTRIGINE 150 MG PO TABS: 75.0000 mg | ORAL_TABLET | Freq: Every day | ORAL | 2 refills | Status: AC

## 2020-05-06 ENCOUNTER — Ambulatory Visit: Payer: Self-pay | Admitting: Licensed Clinical Social Worker

## 2020-05-06 ENCOUNTER — Other Ambulatory Visit: Payer: Self-pay

## 2020-05-06 DIAGNOSIS — F331 Major depressive disorder, recurrent, moderate: Secondary | ICD-10-CM

## 2020-05-06 DIAGNOSIS — F411 Generalized anxiety disorder: Secondary | ICD-10-CM

## 2020-05-06 NOTE — BH Specialist Note (Signed)
Integrated Behavioral Health Follow Up Visit Via Phone  MRN: [id number] Name: Monica Moody  Type of Service: Integrated Behavioral Health- Individual/Family Interpretor:No. Interpretor Name and Language: not applicable.   SUBJECTIVE: Monica Moody is a 54 y.o. female accompanied by herself. Patient was referred by Hurman Horn NP for mental health. Patient reports the following symptoms/concerns: She notes that she finished her exam and class last week. She explains that her course work was cramped into ten weeks with six chapters a week, quizzes, and test the last week. 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 She notes that she will get to sit in an actual operation of her choosing and has toured an actual surgery room. She notes that she went to the beach last month for a week and it was fun. She notes that things are going okay at home and taking things day by day. She notes that things with her anxiety are going pretty well right now. She notes that the past couple of weeks when she was in the midst of her course that she felt anxious and overwhelmed. She discussed situational stressors with her husband. She denies suicidal and homicidal thoughts.  Duration of problem:  Severity of problem: mild  OBJECTIVE: Mood: Euthymic and Affect: Appropriate Risk of harm to self or others: No plan to harm self or others  LIFE CONTEXT: Family and Social: see above.  School/Work: see above. Self-Care: see above. Life Changes: see above.   GOALS ADDRESSED: Patient will: 1.  Reduce symptoms of: anxiety  2.  Increase knowledge and/or ability of: coping skills and self-management skills  3.  Demonstrate ability to: Increase healthy adjustment to current life circumstances  INTERVENTIONS: Interventions utilized:  Supportive Counseling was utilized by the clinician during today's follow up session. Clinician processed with the patient regarding how she has  been doing since the last follow up session. Clinician measured the patient's anxiety and depression on a numerical scale. Clinician utilized reflective listening encouraging the patient to ventilate her feelings towards her current situation. Clinician informed the patient that she has put in her notice at [facility] and her last day is August 19th. 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  Standardized Assessments completed: GAD-7 and PHQ 9 GAD 7 12 PHQ 9 7 ASSESSMENT: Patient currently experiencing see above.    Patient may benefit from see above.   PLAN: 1. Follow up with behavioral health clinician on :  2. Behavioral recommendations: see above.  3. Referral(s): Integrated [employer] [facility] (In Clinic) 4. "From scale of 1-10, how likely are you to follow plan?":   Althia Forts, LCSW

## 2020-05-20 ENCOUNTER — Other Ambulatory Visit: Payer: Self-pay

## 2020-05-20 ENCOUNTER — Ambulatory Visit: Payer: Self-pay

## 2020-05-20 ENCOUNTER — Ambulatory Visit: Payer: Self-pay | Admitting: Licensed Clinical Social Worker

## 2020-05-20 DIAGNOSIS — F411 Generalized anxiety disorder: Secondary | ICD-10-CM

## 2020-05-20 DIAGNOSIS — F331 Major depressive disorder, recurrent, moderate: Secondary | ICD-10-CM

## 2020-05-20 MED ORDER — DULOXETINE HCL 60 MG PO CPEP: 60.0000 mg | ORAL_CAPSULE | Freq: Every day | ORAL | 2 refills | Status: DC

## 2020-05-20 NOTE — BH Specialist Note (Signed)
Integrated Behavioral Health Follow Up Visit Via Phone  MRN: [id number] Name: Monica Moody  Type of Service: Integrated Behavioral Health- Individual/Family Interpretor:No. Interpretor Name and Language:not applicable.   SUBJECTIVE: Monica Moody is a 54 y.o. female accompanied by herself. Patient was referred by  Hurman Horn NP for mental health.  Patient reports the following symptoms/concerns: She notes that she has two day a week clinicals and one night a week that she is working. She notes that she has clinicals for two more weeks and will be officially working. She reports that she gets to sit in on her first surgery tomorrow. She notes that she still gets nervous sometimes and is still learning as she goes. She notes that she is getting used to things as time goes on. She discussed how she can become overwhelmed with going to the main [facility] in [place] and trying to navigate that. She notes that things at home are going okay and her husband still gets in his moods but she tries to ignore it. She denies suicidal and homicidal thoughts.  Duration of problem:  Severity of problem: mild  OBJECTIVE: Mood: Euthymic and Affect: Appropriate Risk of harm to self or others: No plan to harm self or others  LIFE CONTEXT: Family and Social: see above. School/Work: see above. Self-Care: see above.  Life Changes: see above.   GOALS ADDRESSED: Patient will: 1.  Reduce symptoms of: stress  2.  Increase knowledge and/or ability of: self-management skills and stress reduction  3.  Demonstrate ability to: Increase healthy adjustment to current life circumstances  INTERVENTIONS: Interventions utilized:  Supportive Counseling was utilized by the clinician during today's follow up session. Clinician processed with the patient regarding how she has been doing since the last follow up session. Clinician measured the patient's anxiety and depression on a numerical scale.Clinician explained  to the patient that it sounds like although she can get nervous at times that she has learned a lot and is continuing to find a rhythm with things in terms of clinicals. Clinician informed patient that she would send transportation information regarding [facility] in the mail.  Standardized Assessments completed: GAD-7 and PHQ 9 GAD 7 5 PHQ 9 6 ASSESSMENT: Patient currently experiencing see above.   Patient may benefit from see above.  PLAN: 1. Follow up with behavioral health clinician on :  2. Behavioral recommendations:  3. Referral(s): Integrated [employer] (In Clinic) 4. "From scale of 1-10, how likely are you to follow plan?":   Althia Forts, LCSW

## 2020-06-03 ENCOUNTER — Other Ambulatory Visit: Payer: Self-pay

## 2020-06-03 ENCOUNTER — Ambulatory Visit: Payer: Self-pay | Admitting: Licensed Clinical Social Worker

## 2020-06-03 DIAGNOSIS — F411 Generalized anxiety disorder: Secondary | ICD-10-CM

## 2020-06-03 DIAGNOSIS — F331 Major depressive disorder, recurrent, moderate: Secondary | ICD-10-CM

## 2020-06-03 NOTE — BH Specialist Note (Signed)
Integrated Behavioral Health Follow Up Visit Via Phone  MRN: [id number] Name: Monica Moody  Type of Service: Integrated Behavioral Health- Individual/Family Interpretor:No. Interpretor Name and Language: Not applicable.  SUBJECTIVE: Monica Moody is a 54 y.o. female accompanied by herself. Patient was referred by Hurman Horn NP for mental health. Patient reports the following symptoms/concerns: She notes that tomorrow is her last day of clinicals. She notes that she is worried about the fact that [facility] has mandated that all employees to get the COVID 19 vaccine. She notes that it worries her that if the vaccine has not been out long enough and feels like everything is still in testing mode. She notes that it scares her the thought that she has to get the vaccine. She notes that she is not against getting the vaccine but wants more information because its so new. She notes that her sleep pattern will change again after she starts working second shift next week. She explains that she feels like clinicals has prepared her for some of on the job stuff. She discussed situational stressors with her family. She denies suicidal and homicidal thoughts.  Duration of problem: ; Severity of problem: mild  OBJECTIVE: Mood: Euthymic and Affect: Appropriate Risk of harm to self or others: No plan to harm self or others  LIFE CONTEXT: Family and Social: see above. School/Work: see above. Self-Care: see above. Life Changes: see above.  GOALS ADDRESSED: Patient will: 1.  Reduce symptoms of: anxiety  2.  Increase knowledge and/or ability of: healthy habits and self-management skills  3.  Demonstrate ability to: Increase healthy adjustment to current life circumstances  INTERVENTIONS: Interventions utilized:  Supportive Counseling was utilize by the clinician during today's follow up session. Clinician processed with the patient regarding how she has been doing since the last follow up session. Clinician  measured the patient's measured the patient's anxiety and depression on a numerical scale. Clinician utilized reflective listening encouraging the patient to ventilate her feelings towards her current situation. Clinician encouraged the patient to do some research about the vaccine and also ask her primary care provider for advice on making an informed decision about the vaccine. Clinician wished the patient the best of luck on her new job.  Standardized Assessments completed: GAD-7 and PHQ 9 GAD 7 10 PHQ 9 5 ASSESSMENT: Patient currently experiencing see above.  Patient may benefit from see above.   PLAN: 1. Follow up with behavioral health clinician on :  2. Behavioral recommendations: see above.  3. Referral(s): Integrated [employer] (In Clinic) 4. "From scale of 1-10, how likely are you to follow plan?":   Althia Forts, LCSW

## 2020-06-17 ENCOUNTER — Other Ambulatory Visit: Payer: Self-pay

## 2020-06-17 ENCOUNTER — Ambulatory Visit: Payer: Self-pay | Admitting: Licensed Clinical Social Worker

## 2020-06-17 DIAGNOSIS — F411 Generalized anxiety disorder: Secondary | ICD-10-CM

## 2020-06-17 DIAGNOSIS — F331 Major depressive disorder, recurrent, moderate: Secondary | ICD-10-CM

## 2020-06-17 NOTE — BH Specialist Note (Signed)
Integrated Behavioral Health Follow Up Visit Via Phone  MRN: [id number] Name: Monica Moody  Type of Service: Integrated Behavioral Health- Individual/Family Interpretor:No. Interpretor Name and Language: Not applicable.   SUBJECTIVE: Monica Moody is a 54 y.o. female accompanied by herself. Patient was referred by Monica Horn NP for mental health. Patient reports the following symptoms/concerns: She notes that its the first week that she is on second shift at her new job. She notes that she is still a little anxious because its so much that she needs to know and learn. She notes that its overwhelming but she is making it. She notes that she usually leaves an hour to get to work, its a process with parking, walking to the building, going in to get scrubs out of the machine, change, and do all this before you clock in. She notes that it helps her overall mood to be able to get out of the house to go to work and stay busy. She reports that she put in for an exception to get the vaccine because she is scared of it. She explains that she is scared that the vaccine will do something to her based on all the horror stories she has heard. She denies suicidal and homicidal thoughts.  Duration of problem: ; Severity of problem: mild  OBJECTIVE: Mood: Euthymic and Affect: Appropriate Risk of harm to self or others: No plan to harm self or others  LIFE CONTEXT: Family and Social: see above. School/Work: see above. Self-Care: see above. Life Changes: see above.   GOALS ADDRESSED:  Patient will: 1.  Reduce symptoms of: anxiety  2.  Increase knowledge and/or ability of: coping skills and stress reduction  3.  Demonstrate ability to: Increase healthy adjustment to current life circumstances  INTERVENTIONS: Interventions utilized:  Supportive Counseling was utilized by the clinician during today's follow up session. Clinician processed with the patient regarding how she has been doing since the last  follow up session. Clinician measured the patient's anxiety and depression on a numerical scale. Clinician utilized reflective listening encouraging the patient to ventilate her feelings towards her current situation. Clinician suggested that the patient discuss with the primary care provider about her fears regarding the vaccine. Standardized Assessments completed: GAD-7 and PHQ 9 GAD 7 9 PHQ 9 5 ASSESSMENT: Patient currently experiencing see above.  Patient may benefit from see above.  PLAN: 1. Follow up with behavioral health clinician on :  2. Behavioral recommendations: see above. 3. Referral(s): Integrated [employer] (In Clinic) 4. "From scale of 1-10, how likely are you to follow plan?":   Althia Forts, LCSW

## 2020-07-02 ENCOUNTER — Encounter: Payer: Self-pay | Admitting: Emergency Medicine

## 2020-07-02 ENCOUNTER — Ambulatory Visit
Admission: EM | Admit: 2020-07-02 | Discharge: 2020-07-02 | Disposition: A | Discharge status: Home / Self Care | Payer: Self-pay | Attending: Emergency Medicine | Admitting: Emergency Medicine

## 2020-07-02 ENCOUNTER — Other Ambulatory Visit: Payer: Self-pay

## 2020-07-02 DIAGNOSIS — H9201 Otalgia, right ear: Secondary | ICD-10-CM

## 2020-07-02 MED ORDER — FLUTICASONE PROPIONATE 50 MCG/ACT NA SUSP: 1.0000 | Freq: Every day | NASAL | 2 refills | Status: AC

## 2020-07-02 MED ORDER — AZITHROMYCIN 250 MG PO TABS: ORAL_TABLET | ORAL | 0 refills | Status: AC

## 2020-07-02 NOTE — Discharge Instructions (Signed)
Use flonase daily, best effect when used regularly so may take a few days for most benefit.  Ibuprofen or naproxen to help with pain.  I have sent for a course of antibiotics which may help.  If symptoms worsen or do not improve in the next week to return to be seen or to follow up with ENT.

## 2020-07-02 NOTE — ED Triage Notes (Signed)
Patient c/o right ear pain that started on Wed.  Patient denies fevers.  

## 2020-07-02 NOTE — ED Provider Notes (Signed)
right [facility]    CSN: [id number] Arrival date & time: 07/02/20  0941      History   Chief Complaint Chief Complaint  Patient presents with  . Otalgia    right    HPI Monica Moody is a 54 y.o. female.   Monica Moody presents with complaints of right ear pain which started two days ago, noted with chewing. Pain worsened last night, now overall doesn't feel well due to pain. No fevers. No change in hearing. No drainage from the ear or underwater submersion lately. Took ibuprofen which minimally helped. Denies any previous similar. No URI symptoms. No dental pain. No facial swelling. Doesn't follow with a PCP.     ROS per HPI, negative if not otherwise mentioned.      Past Medical History:  Diagnosis Date  . Abscess of chest wall   . Anxiety   . Complication of anesthesia    slow to wake after ablation  . Depression   . GERD (gastroesophageal reflux disease)   . Headache    sinus  . Hyperlipidemia   . Hypertension   . IFG (impaired fasting glucose)   . Kidney stone   . Obesity   . Palpitations   . Tobacco use     Patient Active Problem List   Diagnosis Date Noted  . Allergic conjunctivitis 04/22/2020  . Pruritus 04/06/2020  . Healthcare maintenance 02/03/2020  . Abnormal uterine bleeding 10/17/2018  . Acute exacerbation of chronic bronchitis (HCC) 09/05/2018  . Acute bacterial sinusitis 09/05/2018  . Acute cholecystitis due to biliary calculus 06/26/2018  . Segmental dysfunction of lumbar region 10/18/2016  . Muscle spasm of back 10/18/2016  . Cervical segment dysfunction 10/18/2016  . Abnormal posture 10/18/2016  . Hand pain, right 10/10/2016  . Low back pain 10/10/2016  . Mixed hyperlipidemia 08/22/2016  . BMI 40.0-44.9, adult (HCC) 07/06/2016  . Hypertriglyceridemia 08/31/2015  . Breast cancer screening 08/30/2015  . Vitamin D deficiency 08/30/2015  . Medication monitoring encounter 08/30/2015  . Needs flu shot 08/30/2015  .  Essential hypertension   . GERD (gastroesophageal reflux disease)   . Depression   . Anxiety   . Tobacco use   . IFG (impaired fasting glucose)   . Obesity   . Palpitations     Past Surgical History:  Procedure Laterality Date  . BREAST BIOPSY Left    surgical bx  . BREAST LUMPECTOMY Left   . CHOLECYSTECTOMY N/A 06/26/2018   Procedure: LAPAROSCOPIC CHOLECYSTECTOMY WITH INTRAOPERATIVE CHOLANGIOGRAM;  Surgeon: Ancil Linsey, MD;  Location: [facility];  Service: General;  Laterality: N/A;  . COLONOSCOPY WITH PROPOFOL N/A 11/03/2019   Procedure: COLONOSCOPY WITH PROPOFOL;  Surgeon: [name], MD;  Location[facility];  Service: Endoscopy;  Laterality: N/A;  . mesh sling implated     due to urethra dropping  . OVARIAN CYST REMOVAL Right   . TUBAL LIGATION    . Uterine Ablation  2000    OB History   No obstetric history on file.      Home Medications    Prior to Admission medications   Medication Sig Start Date End Date Taking? Authorizing Provider  cholecalciferol (VITAMIN D) 1000 UNITS tablet Take 2,000 Units by mouth daily.    Yes [provider]  DULoxetine (CYMBALTA) 60 MG capsule Take 1 capsule (60 mg total) by mouth daily. 05/20/20 07/02/20 Yes Iloabachie, Chioma E, NP  fenofibrate (TRICOR) 145 MG tablet Take 1 tablet (145 mg  total) by mouth daily. 02/03/20  Yes Iloabachie, Chioma E, NP  hydrochlorothiazide (HYDRODIURIL) 25 MG tablet Take 1 tablet (25 mg total) by mouth daily. 02/03/20  Yes Iloabachie, Chioma E, NP  lansoprazole (PREVACID) 30 MG capsule TAKE ONE CAPSULE BY MOUTH EVERY DAY BEFORE BREAKFAST 02/03/20  Yes Iloabachie, Chioma E, NP  metoprolol tartrate (LOPRESSOR) 50 MG tablet Take 1 tablet (50 mg total) by mouth 2 (two) times daily. 02/03/20  Yes Iloabachie, Chioma E, NP  azithromycin (ZITHROMAX) 250 MG tablet Take 2 tablets (500 mg total) by mouth daily for 1 day, THEN 1 tablet (250 mg total) daily for 4 days. 07/02/20 07/07/20  Georgetta Haber, NP    fluticasone (FLONASE) 50 MCG/ACT nasal spray Place 1 spray into both nostrils daily. 07/02/20   Georgetta Haber, NP  lamoTRIgine (LAMICTAL) 150 MG tablet Take 0.5 tablets (75 mg total) by mouth daily. 04/29/20 05/29/20  Iloabachie, Chioma E, NP  triamcinolone cream (KENALOG) 0.1 % Apply 1 application topically 2 (two) times daily. 04/06/20   Iloabachie, Chioma E, NP    Family History Family History  Problem Relation Age of Onset  . Arthritis Mother   . Diabetes Mother   . Heart disease Mother   . Hyperlipidemia Mother   . Hypertension Mother   . Migraines Mother   . Cancer Mother        breast  . Diabetes Father   . Hyperlipidemia Father   . Hypertension Father   . Cancer Father        kidney  . Migraines Brother   . Cancer Maternal Aunt        ovarian  . Cancer Paternal Uncle        intestines  . Cancer Maternal Grandmother        stomach  . COPD Maternal Grandfather   . Stroke Neg Hx     Social History Social History   Tobacco Use  . Smoking status: Current Every Day Smoker    Packs/day: 0.50    Years: 35.00    Pack years: 17.50    Types: Cigarettes  . Smokeless tobacco: Never Used  . Tobacco comment: since age 87  Vaping Use  . Vaping Use: Never used  Substance Use Topics  . Alcohol use: Not Currently    Comment: may have occasional drink on Holidays  . Drug use: No     Allergies   Bupropion, Celexa [citalopram hydrobromide], and Penicillins   Review of Systems Review of Systems   Physical Exam Triage Vital Signs ED Triage Vitals  Enc Vitals Group     BP 07/02/20 0950 116/77     Pulse Rate 07/02/20 0950 68     Resp 07/02/20 0950 14     Temp 07/02/20 0950 98.1 F (36.7 C)     Temp Source 07/02/20 0950 Oral     SpO2 07/02/20 0950 98 %     Weight 07/02/20 0948 183 lb (83 kg)     Height 07/02/20 0948 5\' 3"  (1.6 m)     Head Circumference --      Peak Flow --      Pain Score 07/02/20 0948 4     Pain Loc --      Pain Edu? --      Excl. in GC? --     No data found.  Updated Vital Signs BP 116/77 (BP Location: Right Arm)   Pulse 68   Temp 98.1 F (36.7 C) (Oral)   Resp  14   Ht 5\' 3"  (1.6 m)   Wt 183 lb (83 kg)   SpO2 98%   BMI 32.42 kg/m    Physical Exam Constitutional:      General: She is not in acute distress.    Appearance: She is well-developed.  HENT:     Right Ear: Ear canal and external ear normal. No decreased hearing noted. No drainage. A middle ear effusion is present. No mastoid tenderness. Tympanic membrane is not erythematous.     Left Ear: Ear canal and external ear normal.  No middle ear effusion.     Ears:     Comments: Mild effusion to right ear without bulging     Mouth/Throat:     Comments: No dental or jaw pain or tenderness; no trismus  Cardiovascular:     Rate and Rhythm: Normal rate.  Pulmonary:     Effort: Pulmonary effort is normal.  Skin:    General: Skin is warm and dry.  Neurological:     Mental Status: She is alert and oriented to person, place, and time.      UC Treatments / Results  Labs (all labs ordered are listed, but only abnormal results are displayed) Labs Reviewed - No data to display  EKG   Radiology No results found.  Procedures Procedures (including critical care time)  Medications Ordered in UC Medications - No data to display  Initial Impression / Assessment and Plan / UC Course  I have reviewed the triage vital signs and the nursing notes.  Pertinent labs & imaging results that were available during my care of the patient were reviewed by me and considered in my medical decision making (see chart for details).     No dental pain, jaw pain, URI symptoms. No fevers. Worsening of right ear pain with mild effusion noted. Opted to provide azithromycin (penicillin allergy with significant reaction). Encouraged flonase use. Return precautions provided. Patient verbalized understanding and agreeable to plan.   Final Clinical Impressions(s) / UC Diagnoses    Final diagnoses:  Otalgia of right ear     Discharge Instructions     Use flonase daily, best effect when used regularly so may take a few days for most benefit.  Ibuprofen or naproxen to help with pain.  I have sent for a course of antibiotics which may help.  If symptoms worsen or do not improve in the next week to return to be seen or to follow up with ENT.     ED Prescriptions    Medication Sig Dispense Auth. Provider   fluticasone (FLONASE) 50 MCG/ACT nasal spray Place 1 spray into both nostrils daily. 16 g B, NP   azithromycin (ZITHROMAX) 250 MG tablet Take 2 tablets (500 mg total) by mouth daily for 1 day, THEN 1 tablet (250 mg total) daily for 4 days. 6 tablet Linus Mako, NP     PDMP not reviewed this encounter.   Georgetta Haber, NP 07/02/20 1035

## 2020-07-07 ENCOUNTER — Telehealth: Payer: Self-pay | Admitting: Licensed Clinical Social Worker

## 2020-07-07 ENCOUNTER — Ambulatory Visit: Payer: Self-pay | Admitting: Licensed Clinical Social Worker

## 2020-07-07 NOTE — Telephone Encounter (Signed)
Called patient twice regarding today's IBH Follow Up appointment that was scheduled for noon. Left vm with contact information and details on how to reschedule missed appointment before October 1st due to insurance coverage taking effect.

## 2020-07-19 ENCOUNTER — Telehealth: Payer: Self-pay | Admitting: Pharmacist

## 2020-07-19 NOTE — Telephone Encounter (Signed)
07/19/2020 2:19:03 PM - Cymbalta refill to provider  -- Rhetta Mura - Monday, July 19, 2020 2:18 PM --Sending [facility] refill request for Cymbalta 60mg  1 daily to [facility].

## 2020-07-20 ENCOUNTER — Other Ambulatory Visit: Payer: Self-pay

## 2020-07-20 ENCOUNTER — Ambulatory Visit: Payer: Self-pay | Admitting: Licensed Clinical Social Worker

## 2020-07-20 DIAGNOSIS — F411 Generalized anxiety disorder: Secondary | ICD-10-CM

## 2020-07-20 DIAGNOSIS — F331 Major depressive disorder, recurrent, moderate: Secondary | ICD-10-CM

## 2020-07-20 NOTE — BH Specialist Note (Signed)
Integrated Behavioral Health Follow Up Visit  MRN: [id number] Name: Monica Moody   Type of Service: Integrated Behavioral Health- Individual/Family Interpretor:No. Interpretor Name and Language: none  SUBJECTIVE: Monica Moody is a 54 y.o. female accompanied by Herself Patient was referred by  for mental health  Patient reports the following symptoms/concerns: Patient reports that things are about the same since her last session and she is adjusting well to her job. 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 Patient reports she does have medical insurance now and likes her job. Patient also reports she has a new granddaughter that was born on Friday to her youngest son and that causes her to worry a little bit. Overall the patient states she is "doing well." Patient denies any suicidal or homicidal thoughts.  Duration of problem: ; Severity of problem: moderate  OBJECTIVE: Mood: Euthymic and Affect: Appropriate Risk of harm to self or others: No plan to harm self or others  LIFE CONTEXT: Family and Social: see above  School/Work: see above Self-Care: see above Life Changes: see above   GOALS ADDRESSED: Patient will: 1.  Reduce symptoms of: stress  2.  Increase knowledge and/or ability of: coping skills  3.  Demonstrate ability to: Increase healthy adjustment to current life circumstances  INTERVENTIONS: Interventions utilized:  Supportive Counseling was utilized by clinician during today's follow-up session. Clinician processed with patient how she has been doing since her last follow up session with Carey Bullocks, LCSW. The clinician utilized reflective listening and processed with patient her concern regarding her youngest son and granddaughter.Clinician informed the patient that three months of her psychotropic medications would be sent to medication management and she would need to call them and ask for her  prescriptions to be transferred when she chose a new pharmacy.The clinician provided the patient with referrals to [facility] and to [facility] and [facility] and encouraged the patient to call today for an appointment. Clinician offered praise to the patient for doing well on her job and utilizing her coping skills to address the challenges she faces. Clinician informed the patient that she would receive a follow up call in one week and again in three weeks to check on the progress of her referrals.  Standardized Assessments completed: GAD-7 and PHQ 9  Gad-7 6 PHQ-9 1  ASSESSMENT: Patient currently experiencing see above  Patient may benefit from see above  PLAN 1. Patient will receive a three month supply of Duloxetine 60 MG & Lamictal 150 MG 2. Behavioral recommendations: Continue to use coping skills to address stressors in life. 3. Referral(s): Psychiatrist; [facility] or [facility]  4. "From scale of 1-10, how likely are you to follow plan?": 10  Judith Part, Student-Social Work

## 2020-07-21 ENCOUNTER — Other Ambulatory Visit: Payer: Self-pay | Admitting: Gerontology

## 2020-07-21 DIAGNOSIS — E785 Hyperlipidemia, unspecified: Secondary | ICD-10-CM

## 2020-07-23 ENCOUNTER — Other Ambulatory Visit: Payer: Self-pay | Admitting: Gerontology

## 2020-07-27 ENCOUNTER — Telehealth: Payer: Self-pay | Admitting: Licensed Clinical Social Worker

## 2020-07-27 ENCOUNTER — Other Ambulatory Visit: Payer: Self-pay | Admitting: Gerontology

## 2020-07-27 NOTE — Telephone Encounter (Signed)
Called patient to follow up on if she had made an appointment with Dr. Maryruth Bun and if she had picked up her medications.

## 2020-07-29 NOTE — Telephone Encounter (Signed)
Left patient message letting her know that I was following up with her to see if she had made an appointment with Dr. Maryruth Bun and if she had picked up her medication.  Called back to leave Dr. Shelda Altes phone number and patient answered and informed me that she was going to be seeing someone at [facility].

## 2020-08-02 ENCOUNTER — Telehealth: Payer: Self-pay | Admitting: Pharmacist

## 2020-08-02 NOTE — Telephone Encounter (Signed)
08/02/2020 8:32:13 AM - Cymbalta refill faxed to [facility]  -- Rhetta Mura - Monday, August 02, 2020 8:31 AM --Daylene Posey refill request for Cymbalta 60mg  Take 1 capsule by mouth once daily.

## 2020-08-04 ENCOUNTER — Encounter: Payer: Self-pay | Admitting: Gerontology

## 2020-08-10 NOTE — Progress Notes (Signed)
This encounter was created in error - please disregard.

## 2020-08-11 ENCOUNTER — Other Ambulatory Visit: Payer: Self-pay

## 2020-09-06 ENCOUNTER — Other Ambulatory Visit: Payer: Self-pay | Admitting: Gerontology

## 2020-09-06 DIAGNOSIS — Z1231 Encounter for screening mammogram for malignant neoplasm of breast: Secondary | ICD-10-CM

## 2020-09-27 ENCOUNTER — Other Ambulatory Visit: Payer: Self-pay | Admitting: Gerontology

## 2020-09-27 DIAGNOSIS — F331 Major depressive disorder, recurrent, moderate: Secondary | ICD-10-CM

## 2020-10-04 ENCOUNTER — Other Ambulatory Visit: Payer: Self-pay | Admitting: Gerontology

## 2020-10-04 DIAGNOSIS — F331 Major depressive disorder, recurrent, moderate: Secondary | ICD-10-CM

## 2020-11-10 ENCOUNTER — Other Ambulatory Visit: Payer: Self-pay | Admitting: Gerontology

## 2020-11-10 DIAGNOSIS — F331 Major depressive disorder, recurrent, moderate: Secondary | ICD-10-CM

## 2021-01-07 ENCOUNTER — Other Ambulatory Visit: Payer: Self-pay | Admitting: Gerontology

## 2021-01-07 DIAGNOSIS — F331 Major depressive disorder, recurrent, moderate: Secondary | ICD-10-CM

## 2021-01-31 ENCOUNTER — Other Ambulatory Visit: Payer: Self-pay

## 2021-02-11 ENCOUNTER — Other Ambulatory Visit: Payer: Self-pay

## 2021-03-15 ENCOUNTER — Other Ambulatory Visit: Payer: Self-pay

## 2021-03-24 ENCOUNTER — Other Ambulatory Visit: Payer: Self-pay

## 2021-03-25 ENCOUNTER — Telehealth: Payer: Self-pay | Admitting: Pharmacy Technician

## 2021-03-25 NOTE — Telephone Encounter (Signed)
Patient has prescription drug coverage.  No longer meets [facility]'s eligibility criteria. ? ?Rhian Asebedo J. Carnesha Maravilla ?Care Manager ?[facility] ?

## 2021-04-04 ENCOUNTER — Other Ambulatory Visit: Payer: Self-pay

## 2022-03-09 ENCOUNTER — Other Ambulatory Visit: Payer: Self-pay | Admitting: Gerontology

## 2022-03-09 DIAGNOSIS — Z1231 Encounter for screening mammogram for malignant neoplasm of breast: Secondary | ICD-10-CM

## 2022-04-12 ENCOUNTER — Ambulatory Visit
Admission: RE | Admit: 2022-04-12 | Discharge: 2022-04-12 | Disposition: A | Discharge status: Home / Self Care | Payer: BLUE CROSS/BLUE SHIELD | Source: Ambulatory Visit | Attending: Gerontology | Admitting: Gerontology

## 2022-04-12 DIAGNOSIS — Z1231 Encounter for screening mammogram for malignant neoplasm of breast: Secondary | ICD-10-CM | POA: Insufficient documentation

## 2023-03-09 ENCOUNTER — Other Ambulatory Visit: Payer: Self-pay | Admitting: Gerontology

## 2023-03-09 DIAGNOSIS — Z1231 Encounter for screening mammogram for malignant neoplasm of breast: Secondary | ICD-10-CM

## 2023-05-11 ENCOUNTER — Ambulatory Visit
Admission: RE | Admit: 2023-05-11 | Discharge: 2023-05-11 | Disposition: A | Discharge status: Home / Self Care | Payer: BLUE CROSS/BLUE SHIELD | Source: Ambulatory Visit | Attending: Gerontology | Admitting: Gerontology

## 2023-05-11 DIAGNOSIS — Z1231 Encounter for screening mammogram for malignant neoplasm of breast: Secondary | ICD-10-CM | POA: Insufficient documentation

## 2024-03-11 ENCOUNTER — Other Ambulatory Visit: Payer: Self-pay | Admitting: Gerontology

## 2024-03-11 DIAGNOSIS — Z1231 Encounter for screening mammogram for malignant neoplasm of breast: Secondary | ICD-10-CM

## 2024-08-04 ENCOUNTER — Ambulatory Visit

## 2024-09-08 ENCOUNTER — Ambulatory Visit
Admission: RE | Admit: 2024-09-08 | Discharge: 2024-09-08 | Disposition: A | Discharge status: Home / Self Care | Source: Ambulatory Visit | Attending: Gerontology | Admitting: Gerontology

## 2024-09-08 DIAGNOSIS — Z1231 Encounter for screening mammogram for malignant neoplasm of breast: Secondary | ICD-10-CM | POA: Insufficient documentation

## 2024-10-09 ENCOUNTER — Other Ambulatory Visit: Payer: Self-pay | Admitting: Family Medicine

## 2024-10-09 ENCOUNTER — Ambulatory Visit
Admission: RE | Admit: 2024-10-09 | Discharge: 2024-10-09 | Disposition: A | Discharge status: Home / Self Care | Source: Ambulatory Visit | Attending: Family Medicine | Admitting: Family Medicine

## 2024-10-09 DIAGNOSIS — M5416 Radiculopathy, lumbar region: Secondary | ICD-10-CM

## 2024-11-13 ENCOUNTER — Encounter: Payer: Self-pay | Admitting: Gastroenterology

## 2024-11-13 ENCOUNTER — Ambulatory Visit
Admission: RE | Admit: 2024-11-13 | Discharge: 2024-11-13 | Disposition: A | Discharge status: Home / Self Care | Attending: Gastroenterology | Admitting: Gastroenterology

## 2024-11-13 ENCOUNTER — Ambulatory Visit: Admitting: Certified Registered"

## 2024-11-13 ENCOUNTER — Encounter: Admission: RE | Disposition: A | Payer: Self-pay | Source: Home / Self Care | Attending: Gastroenterology

## 2024-11-13 DIAGNOSIS — F17210 Nicotine dependence, cigarettes, uncomplicated: Secondary | ICD-10-CM | POA: Diagnosis not present

## 2024-11-13 DIAGNOSIS — Z83719 Family history of colon polyps, unspecified: Secondary | ICD-10-CM | POA: Insufficient documentation

## 2024-11-13 DIAGNOSIS — Z5941 Food insecurity: Secondary | ICD-10-CM | POA: Insufficient documentation

## 2024-11-13 DIAGNOSIS — I10 Essential (primary) hypertension: Secondary | ICD-10-CM | POA: Diagnosis not present

## 2024-11-13 DIAGNOSIS — Z1211 Encounter for screening for malignant neoplasm of colon: Secondary | ICD-10-CM | POA: Insufficient documentation

## 2024-11-13 DIAGNOSIS — Z8249 Family history of ischemic heart disease and other diseases of the circulatory system: Secondary | ICD-10-CM | POA: Insufficient documentation

## 2024-11-13 HISTORY — DX: Personal history of urinary calculi: Z87.442

## 2024-11-13 HISTORY — PX: COLONOSCOPY: SHX5424

## 2024-11-13 MED ORDER — LIDOCAINE HCL (PF) 2 % IJ SOLN
INTRAMUSCULAR | Status: AC
  Filled 2024-11-13: qty 5

## 2024-11-13 MED ORDER — PROPOFOL 10 MG/ML IV BOLUS
INTRAVENOUS | Status: AC
  Filled 2024-11-13: qty 40

## 2024-11-13 MED ORDER — PROPOFOL 500 MG/50ML IV EMUL
INTRAVENOUS | Status: DC | PRN
  Administered 2024-11-13: 110 ug/kg/min via INTRAVENOUS
  Administered 2024-11-13: 100 mg via INTRAVENOUS

## 2024-11-13 MED ORDER — SODIUM CHLORIDE 0.9 % IV SOLN
INTRAVENOUS | Status: DC
  Administered 2024-11-13: 500 mL via INTRAVENOUS

## 2024-11-13 MED ORDER — LIDOCAINE HCL (CARDIAC) PF 100 MG/5ML IV SOSY
PREFILLED_SYRINGE | INTRAVENOUS | Status: DC | PRN
  Administered 2024-11-13 (×2): 100 mg via INTRAVENOUS

## 2024-11-13 MED ORDER — PROPOFOL 10 MG/ML IV BOLUS
INTRAVENOUS | Status: AC
  Filled 2024-11-13: qty 20

## 2024-11-13 NOTE — Anesthesia Preprocedure Evaluation (Signed)
"                                    Anesthesia Evaluation  Patient identified by MRN, date of birth, ID band Patient awake    Reviewed: Allergy & Precautions, H&P , NPO status , Patient's Chart, lab work & pertinent test results  Airway Mallampati: II  TM Distance: >3 FB Neck ROM: Full    Dental no notable dental hx. (+) Teeth Intact   Pulmonary neg pulmonary ROS, Current Smoker   Pulmonary exam normal breath sounds clear to auscultation       Cardiovascular hypertension, negative cardio ROS Normal cardiovascular exam Rhythm:Regular Rate:Normal     Neuro/Psych negative neurological ROS  negative psych ROS   GI/Hepatic negative GI ROS, Neg liver ROS,,,  Endo/Other  negative endocrine ROS    Renal/GU negative Renal ROS  negative genitourinary   Musculoskeletal negative musculoskeletal ROS (+)    Abdominal   Peds negative pediatric ROS (+)  Hematology negative hematology ROS (+)   Anesthesia Other Findings   Reproductive/Obstetrics negative OB ROS                              Anesthesia Physical Anesthesia Plan  ASA: 2  Anesthesia Plan: General   Post-op Pain Management:    Induction: Intravenous  PONV Risk Score and Plan:   Airway Management Planned:   Additional Equipment:   Intra-op Plan:   Post-operative Plan: Extubation in OR  Informed Consent: I have reviewed the patients History and Physical, chart, labs and discussed the procedure including the risks, benefits and alternatives for the proposed anesthesia with the patient or authorized representative who has indicated his/her understanding and acceptance.     Dental advisory given  Plan Discussed with: CRNA  Anesthesia Plan Comments:         Anesthesia Quick Evaluation  "

## 2024-11-13 NOTE — Op Note (Signed)
 [facility] Patient Name: Monica Moody Procedure Date: 11/13/2024 7:00 AM MRN: [id number] Account #: [id number] Date of Birth: 14-Jan-1966 Admit Type: Outpatient Age: 59 Room: [facility] ENDO ROOM 2 Gender: Female Note Status: Finalized Instrument Name: Colon Scope [id number] Procedure:             Colonoscopy Indications:           Screening for colorectal malignant neoplasm, Screening                         for colon cancer: Family history of colon polyps in                         distant relative(s) Providers:             Ruel Kung MD, MD Referring MD:          Silvano Leaven, MD (Referring MD) Medicines:             Monitored Anesthesia Care Complications:         No immediate complications. Procedure:             Pre-Anesthesia Assessment:                        - Prior to the procedure, a History and Physical was                         performed, and patient medications, allergies and                         sensitivities were reviewed. The patient's tolerance                         of previous anesthesia was reviewed.                        - The risks and benefits of the procedure and the                         sedation options and risks were discussed with the                         patient. All questions were answered and informed                         consent was obtained.                        - ASA Grade Assessment: II - A patient with mild                         systemic disease.                        After obtaining informed consent, the colonoscope was                         passed under direct vision. Throughout the procedure,                         the patient's  blood pressure, pulse, and oxygen                         saturations were monitored continuously. The                         Colonoscope was introduced through the anus and                         advanced to the the cecum, identified by the                          appendiceal orifice. The colonoscopy was performed                         with ease. The patient tolerated the procedure well.                         The quality of the bowel preparation was excellent.                         The ileocecal valve, appendiceal orifice, and rectum                         were photographed. Findings:      The perianal and digital rectal examinations were normal.      The entire examined colon appeared normal on direct and retroflexion       views.      Multiple small-mouthed diverticula were found in the sigmoid colon. Impression:            - The entire examined colon is normal on direct and                         retroflexion views.                        - No specimens collected. Recommendation:        - Discharge patient to home (with escort).                        - Resume previous diet.                        - Continue present medications.                        - Repeat colonoscopy in 10 years for screening                         purposes. Procedure Code(s):     --- Professional ---                        [phone], Colonoscopy, flexible; diagnostic, including                         collection of specimen(s) by brushing or washing, when                         performed (separate procedure) Diagnosis Code(s):     ---  Professional ---                        Z12.11, Encounter for screening for malignant neoplasm                         of colon                        Z83.71, Family history of colonic polyps CPT copyright 2022 American Medical Association. All rights reserved. The codes documented in this report are preliminary and upon coder review may  be revised to meet current compliance requirements. Ruel Kung, MD Ruel Kung MD, MD 11/13/2024 8:03:54 AM This report has been signed electronically. Number of Addenda: 0 Note Initiated On: 11/13/2024 7:00 AM Scope Withdrawal Time: 0 hours 8 minutes 50 seconds  Total Procedure Duration: 0 hours  12 minutes 25 seconds  Estimated Blood Loss:  Estimated blood loss: none.      [facility]

## 2024-11-13 NOTE — Anesthesia Postprocedure Evaluation (Signed)
"   Anesthesia Post Note  Patient: Monica Moody  Procedure(s) Performed: COLONOSCOPY  Patient location during evaluation: PACU Anesthesia Type: General Level of consciousness: awake and alert Pain management: pain level controlled Vital Signs Assessment: post-procedure vital signs reviewed and stable Respiratory status: spontaneous breathing, nonlabored ventilation, respiratory function stable and patient connected to nasal cannula oxygen Cardiovascular status: blood pressure returned to baseline and stable Postop Assessment: no apparent nausea or vomiting Anesthetic complications: no   No notable events documented.   Last Vitals:  Vitals:   11/13/24 0816 11/13/24 0836  BP: (!) 132/95 (!) 138/90  Pulse: 77 76  Resp: 13   Temp:    SpO2: 98% 99%    Last Pain:  Vitals:   11/13/24 0816  TempSrc:   PainSc: 0-No pain                 Fairy A Chyane Greer      "

## 2024-11-13 NOTE — Transfer of Care (Signed)
 Immediate Anesthesia Transfer of Care Note  Patient: Monica Moody  Procedure(s) Performed: COLONOSCOPY  Patient Location: Endoscopy Unit  Anesthesia Type:General  Level of Consciousness: awake, alert , and oriented  Airway & Oxygen Therapy: Patient Spontanous Breathing  Post-op Assessment: Report given to RN and Post -op Vital signs reviewed and stable  Post vital signs: Reviewed and stable  Last Vitals:  Vitals Value Taken Time  BP 107/66 0806  Temp 35.9 0806  Pulse 74 0806  Resp 18 0806  SpO2 99% 0806    Last Pain:  Vitals:   11/13/24 0712  TempSrc: Temporal  PainSc: 0-No pain         Complications: No notable events documented.

## 2024-11-13 NOTE — H&P (Signed)
 "                                                                                                                           Ruel Kung , MD [address], [address], [place], [place], [address] Phone: [phone] Fax: [phone]  Primary Care Physician:  Steva Clotilda DEL, NP   Pre-Procedure History & Physical: HPI:  Monica Moody is a 59 y.o. female is here for an colonoscopy.   Past Medical History:  Diagnosis Date   Abscess of chest wall    Anxiety    Complication of anesthesia    slow to wake after ablation   Depression    GERD (gastroesophageal reflux disease)    Headache    sinus   History of kidney stones    Hyperlipidemia    Hypertension    IFG (impaired fasting glucose)    Obesity    Palpitations    Tobacco use     Past Surgical History:  Procedure Laterality Date   BREAST BIOPSY Left    surgical bx   BREAST LUMPECTOMY Left    CHOLECYSTECTOMY N/A 06/26/2018   Procedure: LAPAROSCOPIC CHOLECYSTECTOMY WITH INTRAOPERATIVE CHOLANGIOGRAM;  Surgeon: Nicholaus Selinda Birmingham, MD;  Location: [facility];  Service: General;  Laterality: N/A;   COLONOSCOPY WITH PROPOFOL  N/A 11/03/2019   Procedure: COLONOSCOPY WITH PROPOFOL ;  Surgeon: Kung Ruel, MD;  Location: [facility];  Service: Endoscopy;  Laterality: N/A;   mesh sling implated     due to urethra dropping   OVARIAN CYST REMOVAL Right    TUBAL LIGATION     Uterine Ablation  2000    Prior to Admission medications  Medication Sig Start Date End Date Taking? Authorizing Provider  cholecalciferol (VITAMIN D ) 1000 UNITS tablet Take 2,000 Units by mouth daily.    Yes [provider]  CYMBALTA  60 MG capsule TAKE ONE CAPSULE BY MOUTH EVERY DAY 10/06/20  Yes Tukov-Yual, Magdalene S, NP  fenofibrate  (TRICOR ) 145 MG tablet Take 1 tablet (145 mg total) by mouth daily. 07/21/20  Yes Iloabachie, Chioma E, NP  fluticasone  (FLONASE ) 50 MCG/ACT nasal spray Place 1 spray into both nostrils daily. 07/02/20  Yes Burky, Natalie B,  NP  hydrochlorothiazide  (HYDRODIURIL ) 25 MG tablet Take 1 tablet (25 mg total) by mouth daily. 02/03/20  Yes Iloabachie, Chioma E, NP  lansoprazole  (PREVACID ) 30 MG capsule TAKE ONE CAPSULE BY MOUTH EVERY DAY BEFORE BREAKFAST 02/03/20  Yes Iloabachie, Chioma E, NP  metoprolol  tartrate (LOPRESSOR ) 50 MG tablet Take 1 tablet (50 mg total) by mouth 2 (two) times daily. 02/03/20  Yes Iloabachie, Chioma E, NP  triamcinolone  cream (KENALOG ) 0.1 % Apply 1 application topically 2 (two) times daily. 04/06/20  Yes Iloabachie, Chioma E, NP  lamoTRIgine  (LAMICTAL ) 150 MG tablet Take 0.5 tablets (75 mg total) by mouth daily. 04/29/20 05/29/20  Iloabachie, Chioma E, NP  lansoprazole  (PREVACID ) 15 MG capsule TAKE 2 CAPSULES BY MOUTH EVERY DAY BEFORE BREAKFAST 07/27/20  Iloabachie, Chioma E, NP    Allergies as of 10/28/2024 - Review Complete 07/02/2020  Allergen Reaction Noted   Bupropion Other (See Comments) 08/25/2015   Celexa [citalopram hydrobromide] Other (See Comments) 08/25/2015   Penicillins Swelling 08/25/2015    Family History  Problem Relation Age of Onset   Arthritis Mother    Diabetes Mother    Heart disease Mother    Hyperlipidemia Mother    Hypertension Mother    Migraines Mother    Cancer Mother        breast   Diabetes Father    Hyperlipidemia Father    Hypertension Father    Cancer Father        kidney   Cancer Maternal Aunt        ovarian   Cancer Paternal Uncle        intestines   Cancer Maternal Grandmother        stomach   COPD Maternal Grandfather    Migraines Brother    Stroke Neg Hx    Breast cancer Neg Hx     Social History   Socioeconomic History   Marital status: Married    Spouse name: Not on file   Number of children: 2   Years of education: some community college   Highest education level: Not on file  Occupational History   Occupation: lawyer  Tobacco Use   Smoking status: Every Day    Current packs/day: 0.50    Average packs/day: 0.5 packs/day  for 35.0 years (17.5 ttl pk-yrs)    Types: Cigarettes   Smokeless tobacco: Never   Tobacco comments:    since age 67  Vaping Use   Vaping status: Never Used  Substance and Sexual Activity   Alcohol use: Not Currently    Comment: may have occasional drink on Holidays   Drug use: No   Sexual activity: Yes    Birth control/protection: None    Comment: Says she's had her tubes tied  Other Topics Concern   Not on file  Social History Narrative   Pt is seeing [name] for therapy at least twice a month to address issues of stress, anxiety, and depression. She has been referred to an organization to assist IPV. Social determinants screening completed on 01/13/2020.   Patient does not require a referral to [facility] at this point in time.    Social Drivers of Health   Tobacco Use: Medium Risk (10/08/2024)   Received from [facility]   Patient History    Smoking Tobacco Use: Former    Smokeless Tobacco Use: Never    Passive Exposure: Past  Physicist, Medical Strain: Low Risk  (09/11/2024)   Received from [facility]   Overall Financial Resource Strain (CARDIA)    Difficulty of Paying Living Expenses: Not very hard  Food Insecurity: Food Insecurity Present (09/11/2024)   Received from [facility]   Epic    Within the past 12 months, you worried that your food would run out before you got the money to buy more.: Sometimes true    Within the past 12 months, the food you bought just didn't last and you didn't have money to get more.: Never true  Transportation Needs: No Transportation Needs (09/11/2024)   Received from [facility] - Transportation    In the past 12 months, has lack of transportation kept you from medical appointments or from getting medications?: No  Lack of Transportation (Non-Medical): No  Physical Activity: Inactive (09/11/2024)   Received from [facility]   Exercise  Vital Sign    On average, how many days per week do you engage in moderate to strenuous exercise (like a brisk walk)?: 0 days    On average, how many minutes do you engage in exercise at this level?: 0 min  Stress: No Stress Concern Present (09/11/2024)   Received from [facility] - Occupational Stress Questionnaire    Feeling of Stress : Only a little  Social Connections: Moderately Isolated (09/11/2024)   Received from [facility]   Social Connection and Isolation Panel    In a typical week, how many times do you talk on the phone with family, friends, or neighbors?: More than three times a week    How often do you get together with friends or relatives?: Once a week    How often do you attend church or religious services?: Never    Do you belong to any clubs or organizations such as church groups, unions, fraternal or athletic groups, or school groups?: No    How often do you attend meetings of the clubs or organizations you belong to?: Never    Are you married, widowed, divorced, separated, never married, or living with a partner?: Married  Intimate Partner Violence: Not on file  Depression (PHQ2-9): Not on file  Alcohol Screen: Not on file  Housing: Low Risk  (09/11/2024)   Received from [facility]   Epic    In the last 12 months, was there a time when you were not able to pay the mortgage or rent on time?: No    In the past 12 months, how many times have you moved where you were living?: 1    At any time in the past 12 months, were you homeless or living in a shelter (including now)?: No  Utilities: Not At Risk (09/11/2024)   Received from [facility]   Epic    In the past 12 months has the electric, gas, oil, or water  company threatened to shut off services in your home?: No  Health Literacy: Adequate Health Literacy (09/11/2024)   Received from [facility]   B1300 Health Literacy    How often do you need to have someone help you when you read instructions, pamphlets, or other written material from your doctor or pharmacy?: Never    Review of Systems: See HPI, otherwise negative ROS  Physical Exam: BP 129/75   Pulse 71   Temp 97.9 F (36.6 C) (Temporal)   Resp 18   Ht 5' 3 (1.6 m)   Wt 110.8 kg   LMP  (LMP Unknown) Comment: postmenopausal  SpO2 96%   BMI 43.26 kg/m  General:   Alert,  pleasant and cooperative in NAD Head:  Normocephalic and atraumatic. Neck:  Supple; no masses or thyromegaly. Lungs:  Clear throughout to auscultation, normal respiratory effort.    Heart:  +S1, +S2, Regular rate and rhythm, No edema. Abdomen:  Soft, nontender and nondistended. Normal bowel sounds, without guarding, and without rebound.   Neurologic:  Alert and  oriented x4;  grossly normal neurologically.  Impression/Plan: Monica Moody is here for an colonoscopy to be performed for Screening colonoscopy, family history of colon polyps Risks, benefits, limitations, and alternatives regarding  colonoscopy have been reviewed with the patient.  Questions  have been answered.  All parties agreeable.   Ruel Kung, MD  11/13/2024, 7:37 AM   "
